# Patient Record
Sex: Female | Born: 1985 | Race: Black or African American | Hispanic: No | Marital: Single | Smoking: Never smoker
Health system: Southern US, Community
[De-identification: ages and names within clinical notes are randomized; demographics above are authoritative.]

## PROBLEM LIST (undated history)

## (undated) ENCOUNTER — Ambulatory Visit (HOSPITAL_COMMUNITY): Discharge status: Absent without leave | Payer: Self-pay

## (undated) DIAGNOSIS — B373 Candidiasis of vulva and vagina: Secondary | ICD-10-CM

## (undated) DIAGNOSIS — A749 Chlamydial infection, unspecified: Principal | ICD-10-CM

## (undated) DIAGNOSIS — R87619 Unspecified abnormal cytological findings in specimens from cervix uteri: Secondary | ICD-10-CM

## (undated) DIAGNOSIS — T7840XA Allergy, unspecified, initial encounter: Secondary | ICD-10-CM

## (undated) DIAGNOSIS — N870 Mild cervical dysplasia: Secondary | ICD-10-CM

## (undated) DIAGNOSIS — B009 Herpesviral infection, unspecified: Secondary | ICD-10-CM

## (undated) DIAGNOSIS — B3731 Acute candidiasis of vulva and vagina: Secondary | ICD-10-CM

## (undated) DIAGNOSIS — N970 Female infertility associated with anovulation: Secondary | ICD-10-CM

## (undated) DIAGNOSIS — D649 Anemia, unspecified: Secondary | ICD-10-CM

## (undated) DIAGNOSIS — IMO0002 Reserved for concepts with insufficient information to code with codable children: Secondary | ICD-10-CM

## (undated) DIAGNOSIS — N926 Irregular menstruation, unspecified: Secondary | ICD-10-CM

## (undated) HISTORY — DX: Unspecified abnormal cytological findings in specimens from cervix uteri: R87.619

## (undated) HISTORY — DX: Reserved for concepts with insufficient information to code with codable children: IMO0002

## (undated) HISTORY — DX: Candidiasis of vulva and vagina: B37.3

## (undated) HISTORY — DX: Allergy, unspecified, initial encounter: T78.40XA

## (undated) HISTORY — DX: Anemia, unspecified: D64.9

## (undated) HISTORY — DX: Irregular menstruation, unspecified: N92.6

## (undated) HISTORY — PX: WISDOM TOOTH EXTRACTION: SHX21

## (undated) HISTORY — DX: Mild cervical dysplasia: N87.0

## (undated) HISTORY — DX: Female infertility associated with anovulation: N97.0

## (undated) HISTORY — DX: Chlamydial infection, unspecified: A74.9

## (undated) HISTORY — DX: Herpesviral infection, unspecified: B00.9

## (undated) HISTORY — DX: Acute candidiasis of vulva and vagina: B37.31

---
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
## 2008-10-29 ENCOUNTER — Emergency Department (HOSPITAL_COMMUNITY): Admission: EM | Admit: 2008-10-29 | Discharge: 2008-10-29 | Payer: Self-pay | Admitting: Emergency Medicine

## 2010-04-16 LAB — URINE MICROSCOPIC-ADD ON

## 2010-04-16 LAB — COMPREHENSIVE METABOLIC PANEL
ALT: 26 U/L (ref 0–35)
AST: 50 U/L — ABNORMAL HIGH (ref 0–37)
Albumin: 4.4 g/dL (ref 3.5–5.2)
Alkaline Phosphatase: 68 U/L (ref 39–117)
BUN: 13 mg/dL (ref 6–23)
CO2: 23 mEq/L (ref 19–32)
Calcium: 9.7 mg/dL (ref 8.4–10.5)
Chloride: 105 mEq/L (ref 96–112)
Creatinine, Ser: 0.94 mg/dL (ref 0.4–1.2)
GFR calc Af Amer: >60 mL/min (ref >60)
GFR calc non Af Amer: >60 mL/min (ref >60)
Glucose, Bld: 78 mg/dL (ref 70–99)
Potassium: 4.1 mEq/L (ref 3.5–5.1)
Sodium: 138 mEq/L (ref 135–145)
Total Bilirubin: 0.2 mg/dL — ABNORMAL LOW (ref 0.3–1.2)
Total Protein: 7.5 g/dL (ref 6.0–8.3)

## 2010-04-16 LAB — CBC
HCT: 41.8 % (ref 36.0–46.0)
Hemoglobin: 14.3 g/dL (ref 12.0–15.0)
MCHC: 34.2 g/dL (ref 30.0–36.0)
MCV: 89.6 fL (ref 78.0–100.0)
Platelets: 208 10*3/uL (ref 150–400)
RBC: 4.67 MIL/uL (ref 3.87–5.11)
RDW: 13.3 % (ref 11.5–15.5)
WBC: 9.4 10*3/uL (ref 4.0–10.5)

## 2010-04-16 LAB — URINALYSIS, ROUTINE W REFLEX MICROSCOPIC
Hgb urine dipstick: NEGATIVE
Nitrite: NEGATIVE
pH: 6.5 (ref 5.0–8.0)

## 2010-04-16 LAB — DIFFERENTIAL
Basophils Absolute: 0 10*3/uL (ref 0.0–0.1)
Basophils Relative: 0 % (ref 0–1)
Eosinophils Absolute: 0 10*3/uL (ref 0.0–0.7)
Lymphocytes Relative: 18 % (ref 12–46)
Lymphs Abs: 1.7 10*3/uL (ref 0.7–4.0)
Monocytes Absolute: 0.5 10*3/uL (ref 0.1–1.0)
Monocytes Relative: 6 % (ref 3–12)
Neutro Abs: 7.2 10*3/uL (ref 1.7–7.7)

## 2010-04-16 LAB — POCT PREGNANCY, URINE: Preg Test, Ur: NEGATIVE

## 2011-08-18 ENCOUNTER — Ambulatory Visit: Payer: Self-pay | Admitting: Obstetrics and Gynecology

## 2011-08-18 DIAGNOSIS — A749 Chlamydial infection, unspecified: Secondary | ICD-10-CM | POA: Insufficient documentation

## 2011-08-18 DIAGNOSIS — Z87898 Personal history of other specified conditions: Secondary | ICD-10-CM | POA: Insufficient documentation

## 2011-08-18 DIAGNOSIS — B009 Herpesviral infection, unspecified: Secondary | ICD-10-CM

## 2011-08-18 DIAGNOSIS — D649 Anemia, unspecified: Secondary | ICD-10-CM | POA: Insufficient documentation

## 2011-08-18 DIAGNOSIS — N970 Female infertility associated with anovulation: Secondary | ICD-10-CM

## 2011-08-18 DIAGNOSIS — N926 Irregular menstruation, unspecified: Secondary | ICD-10-CM | POA: Insufficient documentation

## 2011-08-18 DIAGNOSIS — B373 Candidiasis of vulva and vagina: Secondary | ICD-10-CM | POA: Insufficient documentation

## 2011-08-18 DIAGNOSIS — IMO0002 Reserved for concepts with insufficient information to code with codable children: Secondary | ICD-10-CM | POA: Insufficient documentation

## 2011-08-18 DIAGNOSIS — N870 Mild cervical dysplasia: Secondary | ICD-10-CM | POA: Insufficient documentation

## 2011-08-24 ENCOUNTER — Ambulatory Visit (INDEPENDENT_AMBULATORY_CARE_PROVIDER_SITE_OTHER): Payer: BC Managed Care – PPO | Admitting: Obstetrics and Gynecology

## 2011-08-24 ENCOUNTER — Encounter: Payer: Self-pay | Admitting: Obstetrics and Gynecology

## 2011-08-24 VITALS — BP 120/64 | Ht 61.75 in | Wt 136.0 lb

## 2011-08-24 DIAGNOSIS — N898 Other specified noninflammatory disorders of vagina: Secondary | ICD-10-CM

## 2011-08-24 DIAGNOSIS — R109 Unspecified abdominal pain: Secondary | ICD-10-CM

## 2011-08-24 DIAGNOSIS — A749 Chlamydial infection, unspecified: Secondary | ICD-10-CM

## 2011-08-24 DIAGNOSIS — N870 Mild cervical dysplasia: Secondary | ICD-10-CM

## 2011-08-24 DIAGNOSIS — Z124 Encounter for screening for malignant neoplasm of cervix: Secondary | ICD-10-CM

## 2011-08-24 NOTE — Progress Notes (Signed)
AEX  Last Pap: 08/03/2010 WNL: Yes Regular Periods:yes Contraception: None  Monthly Breast exam:yes every 2 months Tetanus<8yrs:yes Nl.Bladder Function:yes Daily BMs:yes Healthy Diet:yes Calcium:no Mammogram:no Date of Mammogram: n/a Exercise:yes Have often Exercise:3-4 times weekly Seatbelt: yes Abuse at home: no Stressful work:yes Sigmoid-colonoscopy: none Bone Density: No PCP: None Change in [facility]: [facility] Change in [facility]: [facility] Subjective:    Kristina Joyce is a 26 y.o. female, G0P0000, who presents for an annual exam. Pt c/o pain in her right side. States that it varies from sharp pain to dull ache. States that it is more intense during cycle. This pain is similar to that experienced in 2007 when neg [place] was done.  It has continued since then, but is more frequent now, occuring almost monthly. Pt also c/o vaginal d/c on 07/11-07/13 that was clear with no odor.   History   Social History  . Marital Status: Single    Spouse Name: N/A    Number of Children: N/A  . Years of Education: N/A   Social History Main Topics  . Smoking status: Never Smoker   . Smokeless tobacco: Never Used  . Alcohol Use: Yes     occasional   . Drug Use: No  . Sexually Active: Yes    Birth Control/ Protection: None   Other Topics Concern  . None   Social History Narrative  . None    Menstrual cycle:   LMP: Patient's last menstrual period was 07/26/2011.           Cycle: Q28 DAYS for a total of 6 days with 2 days heavy.  No IM bleeding  The following portions of the patient's history were reviewed and updated as appropriate: allergies, current medications, past family history, past medical history, past social history, past surgical history and problem list.  Review of Systems Pertinent items are noted in HPI. Breast:Negative for breast lump,nipple discharge or nipple retraction Gastrointestinal: Negative for abdominal pain, change in bowel habits or rectal  bleeding Urinary:negative   Objective:    BP 120/64  Ht 5' 1.75" (1.568 m)  Wt 136 lb (61.689 kg)  BMI 25.08 kg/m2  LMP 07/26/2011    Weight:  Wt Readings from Last 1 Encounters:  08/24/11 136 lb (61.689 kg)          BMI: Body mass index is 25.08 kg/(m^2).  General Appearance: Alert, appropriate appearance for age. No acute distress HEENT: Grossly normal Neck / Thyroid: Supple, no masses, nodes or enlargement Lungs: clear to auscultation bilaterally Back: No CVA tenderness Breast Exam: No masses or nodes.No dimpling, nipple retraction or discharge. Cardiovascular: Regular rate and rhythm. S1, S2, no murmur Gastrointestinal: Soft, non-tender, no masses or organomegaly Pelvic Exam: External genitalia: normal general appearance Vaginal: normal mucosa without prolapse or lesions Cervix: normal appearance Adnexa: no masses noted Uterus: retroverted, tender and irregular enlargement Rectovaginal: normal rectal, no masses Lymphatic Exam: Non-palpable nodes in neck, clavicular, axillary, or inguinal regions Skin: no rash or abnormalities Neurologic: Normal gait and speech, no tremor  Psychiatric: Alert and oriented, appropriate affect.   Wet Prep:neg whiff. Urinalysis:not applicable UPT: Not done   Assessment:    Persistent right side and flank pain  Possible fibroids with family hx of same   Plan:    pap smear STD screening: done GC/Chl Contraception:no method, declined U/S at [facility]      [facility] PMD

## 2011-08-26 LAB — PAP IG, CT-NG, RFX HPV ASCU
Chlamydia Probe Amp: NEGATIVE
GC Probe Amp: NEGATIVE

## 2011-09-29 ENCOUNTER — Ambulatory Visit (INDEPENDENT_AMBULATORY_CARE_PROVIDER_SITE_OTHER): Payer: BC Managed Care – PPO | Admitting: Obstetrics and Gynecology

## 2011-09-29 ENCOUNTER — Encounter: Payer: Self-pay | Admitting: Obstetrics and Gynecology

## 2011-09-29 ENCOUNTER — Other Ambulatory Visit: Payer: Self-pay | Admitting: Obstetrics and Gynecology

## 2011-09-29 ENCOUNTER — Ambulatory Visit (INDEPENDENT_AMBULATORY_CARE_PROVIDER_SITE_OTHER): Payer: BC Managed Care – PPO

## 2011-09-29 VITALS — BP 100/68 | Ht 61.0 in | Wt 137.0 lb

## 2011-09-29 DIAGNOSIS — R109 Unspecified abdominal pain: Secondary | ICD-10-CM

## 2011-09-29 DIAGNOSIS — N949 Unspecified condition associated with female genital organs and menstrual cycle: Secondary | ICD-10-CM

## 2011-09-29 DIAGNOSIS — D259 Leiomyoma of uterus, unspecified: Secondary | ICD-10-CM

## 2011-09-29 DIAGNOSIS — D219 Benign neoplasm of connective and other soft tissue, unspecified: Secondary | ICD-10-CM

## 2011-09-29 DIAGNOSIS — R102 Pelvic and perineal pain: Secondary | ICD-10-CM

## 2011-09-29 NOTE — Progress Notes (Signed)
F/u visit  SUBJECTIVE: Hasn't had any further episodes of pain since last visit  OBJECTIVE: BP 100/68  Ht 5\' 1"  (1.549 m)  Wt 137 lb (62.143 kg)  BMI 25.89 kg/m2  LMP 09/24/2011  ULTRASOUND: Uterus: Length: 6.79 cm   Width:  4.54 cm   Height:  3.66 cm    Endometrium:   0.355 cm    ROV:   Length: 3.38 cm   Width:   2.56 cm   Height:   1.98 cm  LOV:   Length:  3.38 cm   Width:   2.52 cm   Height:   1.74 cm  Endometrium: 0.355 cm  Left ovary:Normal Right ovary:Normal Fibroids:yes one less than 1 cm.    CDS fluid:yes small  Comment: Transabdominal and transvaginal images of pelvis. Urinary bladder - unremarkable. Retroverted uterus. One LUS fibroid - less than 1 cm is noted.  Endometrium is WNLs Ovaries/Adnexa - WNL Small amount of CDS fluid. WNL  ASSESSMENT: Small asymptomatic fibroid Resolved pelvic pain  RECOMMENDATION: F/u at aex

## 2011-12-08 ENCOUNTER — Ambulatory Visit (INDEPENDENT_AMBULATORY_CARE_PROVIDER_SITE_OTHER): Payer: BC Managed Care – PPO | Admitting: Physician Assistant

## 2011-12-08 VITALS — BP 116/60 | HR 90 | Temp 97.7°F | Resp 16 | Ht 61.0 in | Wt 137.0 lb

## 2011-12-08 DIAGNOSIS — Z111 Encounter for screening for respiratory tuberculosis: Secondary | ICD-10-CM

## 2011-12-08 DIAGNOSIS — Z0000 Encounter for general adult medical examination without abnormal findings: Secondary | ICD-10-CM

## 2011-12-08 DIAGNOSIS — Z23 Encounter for immunization: Secondary | ICD-10-CM

## 2011-12-08 NOTE — Patient Instructions (Signed)
Once the shortage of the TB test is over, I encourage you to get a TB skin test.  Keeping You Healthy  Get These Tests 1. Blood Pressure- Have your blood pressure checked once a year by your health care provider.  Normal blood pressure is 120/80. 2. Weight- Have your body mass index (BMI) calculated to screen for obesity.  BMI is measure of body fat based on height and weight.  You can also calculate your own BMI at https://www.west-esparza.com/. 3. Cholesterol- Have your cholesterol checked every 5 years starting at age 71 then yearly starting at age 68. 4. Chlamydia, HIV, and other sexually transmitted diseases- Get screened every year until age 58, then within three months of each new sexual provider. 5. Pap Smear- Every 1-3 years; discuss with your health care provider. 6. Mammogram- Every year starting at age 4  Take these medicines  Calcium with Vitamin D-Your body needs 1200 mg of Calcium each day and [phone] IU of Vitamin D daily.  Your body can only absorb 500 mg of Calcium at a time so Calcium must be taken in 2 or 3 divided doses throughout the day.  Multivitamin with folic acid- Once daily if it is possible for you to become pregnant.  Get these Immunizations  Gardasil-Series of three doses; prevents HPV related illness such as genital warts and cervical cancer.  Menactra-Single dose; prevents meningitis.  Tetanus shot- Every 10 years.  Flu shot-Every year.  Take these steps 1. Do not smoke-Your healthcare provider can help you quit.  For tips on how to quit go to www.smokefree.gov or call 1-800 QUITNOW. 2. Be physically active- Exercise 5 days a week for at least 30 minutes.  If you are not already physically active, start slow and gradually work up to 30 minutes of moderate physical activity.  Examples of moderate activity include walking briskly, dancing, swimming, bicycling, etc. 3. Breast Cancer- A self breast exam every month is important for early detection of breast  cancer.  For more information and instruction on self breast exams, ask your healthcare provider or [place].es. 4. Eat a healthy diet- Eat a variety of healthy foods such as fruits, vegetables, whole grains, low fat milk, low fat cheeses, yogurt, lean meats, poultry and fish, beans, nuts, tofu, etc.  For more information go to www. Thenutritionsource.org 5. Drink alcohol in moderation- Limit alcohol intake to one drink or less per day. Never drink and drive. 6. Depression- Your emotional health is as important as your physical health.  If you're feeling down or losing interest in things you normally enjoy please talk to your healthcare provider about being screened for depression. 7. Dental visit- Brush and floss your teeth twice daily; visit your dentist twice a year. 8. Eye doctor- Get an eye exam at least every 2 years. 9. Helmet use- Always wear a helmet when riding a bicycle, motorcycle, rollerblading or skateboarding. 10. Safe sex- If you may be exposed to sexually transmitted infections, use a condom. 11. Seat belts- Seat belts can save your live; always wear one. 12. Smoke/Carbon Monoxide detectors- These detectors need to be installed on the appropriate level of your home. Replace batteries at least once a year. 13. Skin cancer- When out in the sun please cover up and use sunscreen 15 SPF or higher. 14. Violence- If anyone is threatening or hurting you, please tell your healthcare provider.

## 2011-12-08 NOTE — Progress Notes (Signed)
  Tuberculosis Risk Questionnaire  1. Were you born outside the [place] in one of the following parts of the world:    [place], [place], [place], [place] or [place]?  No  2. Have you traveled outside the [place] and lived for more than one month in one of the following parts of the world:  [place], [place], [place], [place] or [place]?  No  3. Do you have a compromised immune system such as from any of the following conditions:  HIV/AIDS, organ or bone marrow transplantation, diabetes, immunosuppressive   medicines (e.g. Prednisone, Remicaide), leukemia, lymphoma, cancer of the   head or neck, gastrectomy or jejunal bypass, end-stage renal disease (on   dialysis), or silicosis?  No    4. 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 Have you ever been exposed to anyone with infectious tuberculosis?  No   Tuberculosis Symptom Questionnaire  Do you currently have any of the following symptoms?  1. Unexplained cough lasting more than 3 weeks? No  Unexplained fever lasting more than 3 weeks. No   3. Night Sweats (sweating that leaves the bedclothes and sheets wet)   No  4. Shortness of Breath No  5. Chest Pain No  6. Unintentional weight loss  No  7. Unexplained fatigue (very tired for no reason) No

## 2011-12-08 NOTE — Progress Notes (Signed)
Subjective:    Patient ID: Kristina Joyce, female    DOB: 03/11/1985, 26 y.o.   MRN: [id number]  HPI This 26 y.o. female presents for CPE (pap and breast with GYN 10/2011), TB screening required for work, and influenza vaccine.  She feels well, without problems or concerns.  Past Medical History  Diagnosis Date  . Chlamydia 2007  . Irregular menstrual cycle 2006, Resolved, per patient   . Anemia 2006, Resolved, per patient   . Dysplasia of cervix, low grade (CIN 1)   . HGSIL (high grade squamous intraepithelial dysplasia) Patient unaware of this  . Oligo-ovulation 2006, Resolved, per patient   . Candida vaginitis   . Abnormal Pap smear Reports having follow-up paps Q 6 mos x 3.  All normal since then.  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  Denies ever having had a fever blister or painful blister in the "boxer shorts" area. Also unaware of any HSV testing previously.  . Allergy     Past Surgical History  Procedure Date  . Wisdom tooth extraction     Prior to Admission medications   Medication Sig Start Date End Date Taking? Authorizing Provider  fexofenadine (Aletha) 180 MG tablet Take 180 mg by mouth daily.   Yes Historical Provider, MD  Multiple Vitamin (MULTIVITAMIN) tablet Take 1 tablet by mouth daily.   Yes Historical Provider, MD  Specialty Vitamins Products (VITAMINS FOR HAIR PO) Take 1 tablet by mouth daily.   Yes Historical Provider, MD    No Known Allergies  History   Social History  . Marital Status: Single    Spouse Name: n/a    Number of Children: 0  . Years of Education: 16   Occupational History  . 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 Phlebotomist     part-time   Social History Main Topics  . Smoking status: Never Smoker   . Smokeless tobacco: Never Used  . Alcohol Use: 0.0 - 1.2 oz/week    0-2 Glasses of wine per week     Comment: once a month  . Drug Use: No  . Sexually Active:  Not Currently -- Female partner(s)    Birth Control/ Protection: Condom   Other Topics Concern  . Not on file   Social History Narrative   Lives with her parents.    Family History  Problem Relation Age of Onset  . Cancer Maternal Aunt 1    Breast cancer  . Hypertension Maternal Grandmother   . Diabetes Maternal Grandmother   . Cancer Cousin 29 (patient unaware of any genetic testing in this relative)    Breast Cancer    Review of Systems No chest pain, SOB, HA, dizziness, vision change, N/V, diarrhea, constipation, dysuria, urinary urgency or frequency, myalgias, arthralgias or rash.     Objective:   Physical Exam  Vitals reviewed. Constitutional: She is oriented to person, place, and time. Vital signs are normal. She appears well-developed and well-nourished. No distress.  HENT:  Head: Normocephalic and atraumatic.  Right Ear: Hearing, tympanic membrane, external ear and ear canal normal. No foreign bodies.  Left Ear: Hearing, tympanic membrane, external ear and ear canal normal. No foreign bodies.  Nose: Nose normal.  Mouth/Throat: Uvula is midline, oropharynx is clear and moist and mucous membranes are normal. No oral lesions. Normal dentition. No dental abscesses or uvula swelling. No oropharyngeal exudate.  Eyes: Conjunctivae normal and EOM are normal. Pupils are equal, round,  and reactive to light. Right eye exhibits no discharge. Left eye exhibits no discharge. No scleral icterus.  Fundoscopic exam:      The right eye shows no arteriolar narrowing, no AV nicking, no exudate, no hemorrhage and no papilledema. The right eye shows red reflex.The right eye shows no venous pulsations.      The left eye shows no arteriolar narrowing, no AV nicking, no exudate, no hemorrhage and no papilledema. The left eye shows red reflex.The left eye shows no venous pulsations. Neck: Trachea normal, normal range of motion and full passive range of motion without pain. Neck supple. No spinous  process tenderness and no muscular tenderness present. No mass and no thyromegaly present.  Cardiovascular: Normal rate, regular rhythm, normal heart sounds, intact distal pulses and normal pulses.   Pulmonary/Chest: Effort normal and breath sounds normal.  Genitourinary: Rectum normal and vagina normal.  Musculoskeletal: She exhibits no edema and no tenderness.       Cervical back: Normal.       Thoracic back: Normal.       Lumbar back: Normal.  Lymphadenopathy:       Head (right side): No tonsillar, no preauricular, no posterior auricular and no occipital adenopathy present.       Head (left side): No tonsillar, no preauricular, no posterior auricular and no occipital adenopathy present.    She has no cervical adenopathy.       Right: No supraclavicular adenopathy present.       Left: No supraclavicular adenopathy present.  Neurological: She is alert and oriented to person, place, and time. She has normal strength and normal reflexes. No cranial nerve deficit. She exhibits normal muscle tone. Coordination and gait normal.  Skin: Skin is warm, dry and intact. No rash noted. She is not diaphoretic. No cyanosis or erythema. Nails show no clubbing.  Psychiatric: She has a normal mood and affect. Her speech is normal and behavior is normal. Judgment and thought content normal.      Assessment & Plan:   1. Routine general medical examination at a health care facility  Age appropriate anticipatory guidance provided.   2. Screening-pulmonary TB  Letter provided.  No infectious TB.  Defer screening until skin test shortage resolved.  3. Need for influenza vaccination  Flu vaccine greater than or equal to 3yo preservative free IM   I will forward this note to Dr. Pennie Rushing to clarify the patient's history of HGSIL pap and HSV type 1 infection.  Resolved the items from her problem list that are no longer active, per the patient.  Advised the patient to contact her cousin to find out if she had any  genetic testing done that could change the breast cancer screening recommendations for the patient.

## 2016-03-19 ENCOUNTER — Ambulatory Visit (INDEPENDENT_AMBULATORY_CARE_PROVIDER_SITE_OTHER): Payer: Worker's Compensation

## 2016-03-19 ENCOUNTER — Ambulatory Visit (HOSPITAL_COMMUNITY)
Admission: EM | Admit: 2016-03-19 | Discharge: 2016-03-19 | Disposition: A | Discharge status: Home / Self Care | Payer: Worker's Compensation | Attending: Emergency Medicine | Admitting: Emergency Medicine

## 2016-03-19 ENCOUNTER — Encounter (HOSPITAL_COMMUNITY): Payer: Self-pay | Admitting: Emergency Medicine

## 2016-03-19 DIAGNOSIS — M25562 Pain in left knee: Secondary | ICD-10-CM

## 2016-03-19 MED ORDER — TRAMADOL HCL 50 MG PO TABS: 50.0000 mg | ORAL_TABLET | Freq: Four times a day (QID) | ORAL | 0 refills | Status: DC | PRN

## 2016-03-19 MED ORDER — DICLOFENAC SODIUM 75 MG PO TBEC: 75.0000 mg | DELAYED_RELEASE_TABLET | Freq: Two times a day (BID) | ORAL | 0 refills | Status: DC

## 2016-03-19 NOTE — ED Provider Notes (Signed)
HPI  SUBJECTIVE:  Kristina Joyce is a 31 y.o. female who presents with constant, throbbing left knee pain. Patient states she was playing a basketball game at work, came down and felt a "pop" of her knee. She is unsure if she bent her knee outward inward. She states that she can't fully extend her knee. She tried ice with some improvement in her symptoms. Symptoms are worse with any range of motion. She was unable to bear weight on it immediately and is unable to bear weight on it here in the department. No erythema, distal numbness or tingling. Reports weight secondary to pain. Past medical history negative for left knee injury, diabetes, hypertension. LMP: 2/19. PMD: Dr. Ebony Hail  Discussed worker's comp case. She is a Careers information officer for [employer]    Past Medical History:  Diagnosis Date  . Abnormal Pap smear   . Allergy   . Anemia   . Candida vaginitis   . Chlamydia 2007  . Dysplasia of cervix, low grade (CIN 1)   . HGSIL (high grade squamous intraepithelial dysplasia)   . HSV-1 infection   . Irregular menstrual cycle   . Oligo-ovulation     Past Surgical History:  Procedure Laterality Date  . WISDOM TOOTH EXTRACTION      Family History  Problem Relation Age of Onset  . Cancer Maternal Aunt 57    Breast cancer  . Hypertension Maternal Grandmother   . Diabetes Maternal Grandmother   . Cancer Cousin 29    Breast Cancer    Social History  Substance Use Topics  . Smoking status: Never Smoker  . Smokeless tobacco: Never Used  . Alcohol use 0.0 - 1.2 oz/week     Comment: once a month    No current facility-administered medications for this encounter.   Current Outpatient Prescriptions:  .  diclofenac (VOLTAREN) 75 MG EC tablet, Take 1 tablet (75 mg total) by mouth 2 (two) times daily. Take with food, Disp: 30 tablet, Rfl: 0 .  fexofenadine (Dorthula) 180 MG tablet, Take 180 mg by mouth daily., Disp: , Rfl:  .  Multiple Vitamin (MULTIVITAMIN) tablet, Take 1  tablet by mouth daily., Disp: , Rfl:  .  Specialty Vitamins Products (VITAMINS FOR HAIR PO), Take 1 tablet by mouth daily., Disp: , Rfl:  .  traMADol (ULTRAM) 50 MG tablet, Take 1 tablet (50 mg total) by mouth every 6 (six) hours as needed., Disp: 20 tablet, Rfl: 0  No Known Allergies   ROS  As noted in HPI.   Physical Exam  BP 117/80 (BP Location: Left Arm)   Pulse 92   Temp 99.4 F (37.4 C) (Oral)   Resp 14   LMP 03/01/2016   SpO2 100%   Constitutional: Well developed, well nourished, mild Painful distress Eyes:  EOMI, conjunctiva normal bilaterally HENT: Normocephalic, atraumatic,mucus membranes moist Respiratory: Normal inspiratory effort Cardiovascular: Normal rate GI: nondistended skin: No rash, skin intact Musculoskeletal: Positive mild swelling of the knee, L Knee ROM decreased due to pain,  unable to flex to 90 degrees and fully extend due to pain, prefers to hold knee slightly flexed, Patella NT, Patellar apprehension test negative, Patellar tendon NT, Medial joint  tender, Lateral joint NT, Popliteal region NT, positive laxity with anterior drawer test but she has an endpoint, Varus LCL stress testing stable but painful, Valgus MCL stress testing stable but painful, McMurray's testing normal, distal NVI with intact baseline sensation / motor / pulse distal to knee. No effusion. No  erythema. No increased temperature.  Neurologic: Alert & oriented x 3, no focal neuro deficits Psychiatric: Speech and behavior appropriate   ED Course   Medications - No data to display  Orders Placed This Encounter  Procedures  . DG Knee Complete 4 Views Left    Standing Status:   Standing    Number of Occurrences:   1    Order Specific Question:   Reason for Exam (SYMPTOM  OR DIAGNOSIS REQUIRED)    Answer:   trauma  . Apply knee immobilizer    Standing Status:   Standing    Number of Occurrences:   1    Order Specific Question:   Laterality    Answer:   Left    Order Specific  Question:   Knee Immobilizer Instruction    Answer:   When walking  . Crutches    Standing Status:   Standing    Number of Occurrences:   1    No results found for this or any previous visit (from the past 24 hour(s)). Dg Knee Complete 4 Views Left  Result Date: 03/19/2016 CLINICAL DATA:  [place] at basketball game a few hours ago, injured LEFT knee. EXAM: LEFT KNEE - COMPLETE 4+ VIEW COMPARISON:  None. FINDINGS: No evidence of fracture, dislocation, or joint effusion. No evidence of arthropathy or other focal bone abnormality. Soft tissues are unremarkable. IMPRESSION: Negative. Electronically Signed   By: Elon Alas M.D.   On: 03/19/2016 20:22    ED Clinical Impression  Acute pain of left knee   ED Assessment/Plan  Reviewed imaging independently. No fracture, dislocation, effusion. See radiology report for details.  Suspect partial anterior cruciate ligament tear and also medial lateral collateral limits this injury based on physical exam.  We will write her out for Monday and Tuesday so that she has time to follow-up with occupational health. She is not working over the weekend, so this is not more than 3 days. Gave patient knee immobilizer and instructed her to take her knee out frequently and move it around. Instructed her to not wear it for more than for 5 days. also home with.  diclofenac, tramadol, ice, elevation.Follow-up with occupational health Monday or Tuesday for possible referral to orthopedics and further work restrictions. To the ER if she gets worse   Discussed imaging, MDM, plan and followup with patient. Discussed sn/sx that should prompt return to the ED. Patient  agrees with plan.   Meds ordered this encounter  Medications  . diclofenac (VOLTAREN) 75 MG EC tablet    Sig: Take 1 tablet (75 mg total) by mouth 2 (two) times daily. Take with food    Dispense:  30 tablet    Refill:  0  . traMADol (ULTRAM) 50 MG tablet    Sig: Take 1 tablet (50 mg total) by mouth  every 6 (six) hours as needed.    Dispense:  20 tablet    Refill:  0    *This clinic note was created using Lobbyist. Therefore, there may be occasional mistakes despite careful proofreading.  ?   Melynda Ripple, MD 03/19/16 2131

## 2016-03-19 NOTE — Discharge Instructions (Signed)
°  Follow-up with occupational health on Monday or Tuesday. You will most likely need a referral to orthopedics.  [address], [place], [place] [address] Phone: [phone]  Take your knee out and move it around as much as possible. Do not wear the knee immobilizer for more than 4 or 5 days. If crutches are too cumbersome, then try a cane.  Diclofenac twice a day. 1 g of Tylenol 4 times a day. Tramadol for severe pain only. It is okay take all of these medicines together.

## 2016-03-19 NOTE — ED Triage Notes (Signed)
Pt reports she was playing basketball today around 1830 and went up for a lay up and when she came down, her knee "buckled" and pt heard a pop  Pain increases w/activity and unable to extend leg  Brought back on wheelchair... A&O x4... NAD

## 2016-03-22 ENCOUNTER — Ambulatory Visit (INDEPENDENT_AMBULATORY_CARE_PROVIDER_SITE_OTHER): Payer: Self-pay | Admitting: Orthopedic Surgery

## 2016-03-23 NOTE — ED Notes (Signed)
Pt called needing excuse note to not fly on an airplane  Per Dr. Valere Dross, ok to supply pt w/note  Note left at front office.

## 2018-07-18 ENCOUNTER — Other Ambulatory Visit: Payer: Self-pay | Admitting: *Deleted

## 2018-07-18 DIAGNOSIS — Z20822 Contact with and (suspected) exposure to covid-19: Secondary | ICD-10-CM

## 2018-07-24 LAB — NOVEL CORONAVIRUS, NAA: SARS-CoV-2, NAA: NOT DETECTED

## 2018-07-25 ENCOUNTER — Telehealth: Payer: Self-pay

## 2018-07-25 NOTE — Telephone Encounter (Signed)
Patient called and received her test results

## 2019-10-31 ENCOUNTER — Emergency Department (HOSPITAL_COMMUNITY): Payer: BC Managed Care – PPO

## 2019-10-31 ENCOUNTER — Other Ambulatory Visit: Payer: Self-pay

## 2019-10-31 ENCOUNTER — Emergency Department (HOSPITAL_COMMUNITY)
Admission: EM | Admit: 2019-10-31 | Discharge: 2019-10-31 | Disposition: A | Discharge status: Home / Self Care | Payer: BC Managed Care – PPO | Attending: Emergency Medicine | Admitting: Emergency Medicine

## 2019-10-31 ENCOUNTER — Encounter (HOSPITAL_COMMUNITY): Payer: Self-pay | Admitting: Emergency Medicine

## 2019-10-31 DIAGNOSIS — D259 Leiomyoma of uterus, unspecified: Secondary | ICD-10-CM | POA: Diagnosis not present

## 2019-10-31 DIAGNOSIS — R110 Nausea: Secondary | ICD-10-CM | POA: Insufficient documentation

## 2019-10-31 DIAGNOSIS — R102 Pelvic and perineal pain: Secondary | ICD-10-CM

## 2019-10-31 DIAGNOSIS — R1031 Right lower quadrant pain: Secondary | ICD-10-CM | POA: Diagnosis present

## 2019-10-31 LAB — COMPREHENSIVE METABOLIC PANEL
ALT: 16 U/L (ref 0–44)
AST: 22 U/L (ref 15–41)
Albumin: 4.2 g/dL (ref 3.5–5.0)
Alkaline Phosphatase: 42 U/L (ref 38–126)
Anion gap: 8 (ref 5–15)
BUN: 12 mg/dL (ref 6–20)
CO2: 23 mmol/L (ref 22–32)
Calcium: 9.4 mg/dL (ref 8.9–10.3)
Chloride: 104 mmol/L (ref 98–111)
Creatinine, Ser: 0.86 mg/dL (ref 0.44–1.00)
GFR, Estimated: >60 mL/min (ref >60)
Glucose, Bld: 112 mg/dL — ABNORMAL HIGH (ref 70–99)
Potassium: 3.8 mmol/L (ref 3.5–5.1)
Sodium: 135 mmol/L (ref 135–145)
Total Bilirubin: 0.5 mg/dL (ref 0.3–1.2)
Total Protein: 7.3 g/dL (ref 6.5–8.1)

## 2019-10-31 LAB — URINALYSIS, ROUTINE W REFLEX MICROSCOPIC
Bilirubin Urine: NEGATIVE
Glucose, UA: NEGATIVE mg/dL
Hgb urine dipstick: NEGATIVE
Ketones, ur: 5 mg/dL — AB
Leukocytes,Ua: NEGATIVE
Nitrite: NEGATIVE
Protein, ur: 30 mg/dL — AB
Specific Gravity, Urine: 1.024 (ref 1.005–1.030)
pH: 7 (ref 5.0–8.0)

## 2019-10-31 LAB — CBC
HCT: 39.4 % (ref 36.0–46.0)
Hemoglobin: 13 g/dL (ref 12.0–15.0)
MCH: 29 pg (ref 26.0–34.0)
MCHC: 33 g/dL (ref 30.0–36.0)
MCV: 87.9 fL (ref 80.0–100.0)
Platelets: 202 10*3/uL (ref 150–400)
RBC: 4.48 MIL/uL (ref 3.87–5.11)
RDW: 13.1 % (ref 11.5–15.5)
WBC: 9.3 10*3/uL (ref 4.0–10.5)
nRBC: 0 % (ref 0.0–0.2)

## 2019-10-31 LAB — I-STAT BETA HCG BLOOD, ED (MC, WL, AP ONLY): I-stat hCG, quantitative: <5 m[IU]/mL (ref <5)

## 2019-10-31 LAB — LIPASE, BLOOD: Lipase: 29 U/L (ref 11–51)

## 2019-10-31 MED ORDER — IOHEXOL 300 MG/ML  SOLN
100.0000 mL | Freq: Once | INTRAMUSCULAR | Status: AC | PRN
  Administered 2019-10-31: 100 mL via INTRAVENOUS

## 2019-10-31 MED ORDER — ACETAMINOPHEN 325 MG PO TABS
650.0000 mg | ORAL_TABLET | Freq: Once | ORAL | Status: AC
  Administered 2019-10-31: 650 mg via ORAL
  Filled 2019-10-31: qty 2

## 2019-10-31 MED ORDER — SODIUM CHLORIDE 0.9 % IV BOLUS
500.0000 mL | Freq: Once | INTRAVENOUS | Status: AC
  Administered 2019-10-31: 500 mL via INTRAVENOUS

## 2019-10-31 MED ORDER — MORPHINE SULFATE (PF) 4 MG/ML IV SOLN
4.0000 mg | Freq: Once | INTRAVENOUS | Status: AC
  Administered 2019-10-31: 4 mg via INTRAVENOUS
  Filled 2019-10-31 (×2): qty 1

## 2019-10-31 MED ORDER — ONDANSETRON HCL 4 MG/2ML IJ SOLN
4.0000 mg | Freq: Once | INTRAMUSCULAR | Status: AC
  Administered 2019-10-31: 4 mg via INTRAVENOUS
  Filled 2019-10-31: qty 2

## 2019-10-31 MED ORDER — HYDROMORPHONE HCL 1 MG/ML IJ SOLN
0.5000 mg | Freq: Once | INTRAMUSCULAR | Status: AC
  Administered 2019-10-31: 0.5 mg via INTRAVENOUS
  Filled 2019-10-31: qty 1

## 2019-10-31 NOTE — ED Provider Notes (Signed)
[facility] EMERGENCY DEPARTMENT Provider Note   CSN: [id number] Arrival date & time: 10/31/19  1123     History Chief Complaint  Patient presents with  . Abdominal Pain    Kristina Joyce is a 34 y.o. female presents today for right lower quadrant pain onset 2 AM this morning.  Patient describes intermittent sharp pain nonradiating moderate intensity worsened with laying improved with standing denies similar pain in the past.  Associated with nausea without vomiting.  Denies fever/chills, fall/injury, chest pain/shortness of breath, cough, upper abdominal pain, dysuria/hematuria, vaginal bleeding/discharge, concern for STI or any additional concerns.  HPI     Past Medical History:  Diagnosis Date  . Abnormal Pap smear   . Allergy   . Anemia   . Candida vaginitis   . Chlamydia 2007  . Dysplasia of cervix, low grade (CIN 1)   . HGSIL (high grade squamous intraepithelial dysplasia)   . HSV-1 infection   . Irregular menstrual cycle   . Oligo-ovulation     Patient Active Problem List   Diagnosis Date Noted  . Fibroid 09/29/2011  . Anemia   . Candida vaginitis   . History of abnormal Pap smear   . HSV-1 infection     Past Surgical History:  Procedure Laterality Date  . WISDOM TOOTH EXTRACTION       OB History    Gravida  0   Para  0   Term  0   Preterm  0   AB  0   Living  0     SAB  0   TAB  0   Ectopic  0   Multiple  0   Live Births              Family History  Problem Relation Age of Onset  . Cancer Maternal Aunt 57       Breast cancer  . Hypertension Maternal Grandmother   . Diabetes Maternal Grandmother   . Cancer Cousin 29       Breast Cancer    Social History   Tobacco Use  . Smoking status: Never Smoker  . Smokeless tobacco: Never Used  Substance Use Topics  . Alcohol use: Yes    Alcohol/week: 0.0 - 2.0 standard drinks    Comment: once a month  . Drug use: No    Home Medications Prior to  Admission medications   Medication Sig Start Date End Date Taking? Authorizing Provider  diclofenac (VOLTAREN) 75 MG EC tablet Take 1 tablet (75 mg total) by mouth 2 (two) times daily. Take with food 03/19/16   Melynda Ripple, MD  fexofenadine (Raguel) 180 MG tablet Take 180 mg by mouth daily.    [provider]  Multiple Vitamin (MULTIVITAMIN) tablet Take 1 tablet by mouth daily.    [provider]  Specialty Vitamins Products (VITAMINS FOR HAIR PO) Take 1 tablet by mouth daily.    [provider]  traMADol (ULTRAM) 50 MG tablet Take 1 tablet (50 mg total) by mouth every 6 (six) hours as needed. 03/19/16   Melynda Ripple, MD    Allergies    Patient has no known allergies.  Review of Systems   Review of Systems Ten systems are reviewed and are negative for acute change except as noted in the HPI  Physical Exam Updated Vital Signs BP 120/81   Pulse 80   Temp 98 F (36.7 C) (Oral)   Resp 14   Ht 5\' 1"  (1.549  m)   Wt 65.3 kg   LMP 10/05/2019   SpO2 100%   BMI 27.21 kg/m   Physical Exam Constitutional:      General: She is not in acute distress.    Appearance: Normal appearance. She is well-developed. She is not ill-appearing or diaphoretic.  HENT:     Head: Normocephalic and atraumatic.  Eyes:     General: Vision grossly intact. Gaze aligned appropriately.     Pupils: Pupils are equal, round, and reactive to light.  Neck:     Trachea: Trachea and phonation normal.  Pulmonary:     Effort: Pulmonary effort is normal. No respiratory distress.  Abdominal:     General: There is no distension.     Palpations: Abdomen is soft.     Tenderness: There is abdominal tenderness in the right lower quadrant. There is no guarding or rebound.  Genitourinary:    Comments: Refused by patient Musculoskeletal:        General: Normal range of motion.     Cervical back: Normal range of motion.  Skin:    General: Skin is warm and dry.  Neurological:     Mental  Status: She is alert.     GCS: GCS eye subscore is 4. GCS verbal subscore is 5. GCS motor subscore is 6.     Comments: Speech is clear and goal oriented, follows commands Major Cranial nerves without deficit, no facial droop Moves extremities without ataxia, coordination intact  Psychiatric:        Behavior: Behavior normal.     ED Results / Procedures / Treatments   Labs (all labs ordered are listed, but only abnormal results are displayed) Labs Reviewed  COMPREHENSIVE METABOLIC PANEL - Abnormal; Notable for the following components:      Result Value   Glucose, Bld 112 (*)    All other components within normal limits  URINALYSIS, ROUTINE W REFLEX MICROSCOPIC - Abnormal; Notable for the following components:   APPearance HAZY (*)    Ketones, ur 5 (*)    Protein, ur 30 (*)    Bacteria, UA RARE (*)    All other components within normal limits  LIPASE, BLOOD  CBC  I-STAT BETA HCG BLOOD, ED ([facility], [facility], [facility] ONLY)    EKG None  Radiology CT ABDOMEN PELVIS W CONTRAST  Result Date: 10/31/2019 CLINICAL DATA:  Right lower quadrant pain EXAM: CT ABDOMEN AND PELVIS WITH CONTRAST TECHNIQUE: Multidetector CT imaging of the abdomen and pelvis was performed using the standard protocol following bolus administration of intravenous contrast. CONTRAST:  119mL OMNIPAQUE IOHEXOL 300 MG/ML  SOLN COMPARISON:  None. FINDINGS: Lower chest: No acute abnormality. Hepatobiliary: No focal liver abnormality is seen. No gallstones, gallbladder wall thickening, or biliary dilatation. Pancreas: Unremarkable. Spleen: Unremarkable. Adrenals/Urinary Tract: Adrenals are unremarkable. There is mild right hydroureteronephrosis likely secondary to fibroid uterus. Bladder is unremarkable. Stomach/Bowel: Stomach is within normal limits. Bowel is normal in caliber. Appendix is normal in caliber. Vascular/Lymphatic: No significant vascular findings are present. There are no enlarged lymph nodes identified. Reproductive: Bulky  fibroid uterus.  No adnexal mass identified. Other: Trace pelvic free fluid is probably physiologic. No abdominal wall hernia. Musculoskeletal: No acute or significant osseous abnormality. IMPRESSION: Normal appendix. Bulky, fibroid uterus. Likely associated mild hydroureteronephrosis. Electronically Signed   By: Macy Mis M.D.   On: 10/31/2019 15:29   US PELVIC COMPLETE W TRANSVAGINAL AND TORSION R/O  Result Date: 10/31/2019 CLINICAL DATA:  RIGHT lower quadrant pain since early this  morning with nausea and vomiting, LMP 10/04/2019 EXAM: TRANSABDOMINAL AND TRANSVAGINAL ULTRASOUND OF PELVIS DOPPLER ULTRASOUND OF OVARIES TECHNIQUE: Both transabdominal and transvaginal ultrasound examinations of the pelvis were performed. Transabdominal technique was performed for global imaging of the pelvis including uterus, ovaries, adnexal regions, and pelvic cul-de-sac. It was necessary to proceed with endovaginal exam following the transabdominal exam to visualize the endometrium. Color and duplex Doppler ultrasound was utilized to evaluate blood flow to the ovaries. COMPARISON:  None FINDINGS: Uterus Measurements: 13.5 x 7.3 x 11.0 cm = volume: 566 mL. Heterogeneous myometrium. Anteverted. Multiple masses consistent with leiomyomata. Largest mass is at the posterior uterus 11.9 x 7.5 x 6.5 cm, transmural. Additional masses at the upper uterus measure subserosal 4.0 x 2.3 x 4.0 cm and intramural 2.4 x 1.7 x 2.2 cm. Endometrium Thickness: 17 mm at upper uterine segment. Obscured at mid uterus. No endometrial fluid Right ovary Measurements: 3.2 x 3.0 x 2.0 cm = volume: 10.2 mL. Normal morphology without mass. Internal blood flow present on color Doppler imaging. Left ovary Measurements: 3.2 x 2.2 x 3.0 cm = volume: 11.0 mL. Normal morphology without mass. Internal blood flow present on color Doppler imaging. Pulsed Doppler evaluation of both ovaries demonstrates normal low-resistance arterial and venous waveforms. Other  findings No free pelvic fluid.  No adnexal masses. IMPRESSION: Enlarged uterus containing multiple uterine leiomyomata. Otherwise negative exam. No evidence of ovarian torsion. Electronically Signed   By: Lavonia Dana M.D.   On: 10/31/2019 14:03    Procedures Procedures (including critical care time)  Medications Ordered in ED Medications  morphine 4 MG/ML injection 4 mg (4 mg Intravenous Given 10/31/19 1535)  acetaminophen (TYLENOL) tablet 650 mg (650 mg Oral Given 10/31/19 1231)  iohexol (OMNIPAQUE) 300 MG/ML solution 100 mL (100 mLs Intravenous Contrast Given 10/31/19 1516)  ondansetron (ZOFRAN) injection 4 mg (4 mg Intravenous Given 10/31/19 1535)  sodium chloride 0.9 % bolus 500 mL (0 mLs Intravenous Stopped 10/31/19 1648)  HYDROmorphone (DILAUDID) injection 0.5 mg (0.5 mg Intravenous Given 10/31/19 1639)    ED Course  I have reviewed the triage vital signs and the nursing notes.  Pertinent labs & imaging results that were available during my care of the patient were reviewed by me and considered in my medical decision making (see chart for details).    MDM Rules/Calculators/A&P                         Additional history obtained from: 1. Nursing notes from this visit. 2. Review of electronic medical record system. ------------------------------------- A 34 year old female presented for right lower quadrant pain onset 2 AM this morning.  Pain is been intermittent associated with some nausea.  No infectious type symptoms.  She is mildly tender in the right lower quadrant no rebound or guarding.  She refused pelvic examination denies any concern for STI or abnormal vaginal discharge or bleeding.  Concern on initial evaluation given the intermittent nature of her pain is for a ovarian torsion.  Discussed plan of care with patient, shared decision making made she is agreeable for pelvic ultrasound with torsion rule out and basic labs. - CBC within normal limits, no leukocytosis to  suggest infection and no anemia. Lipase within normal limits doubt pancreatitis. Pregnancy test negative, doubt ectopic. CMP shows glucose 112 otherwise within normal limits; no emergent electrolyte derangement, AKI, LFT elevations or gap. Urinalysis shows some ketones and protein suspicious for dehydration no evidence of infection.  Pelvic ultrasound  with Doppler:  IMPRESSION:  Enlarged uterus containing multiple uterine leiomyomata.    Otherwise negative exam.    No evidence of ovarian torsion.    - Patient reevaluated, she has continued right lower quadrant pain, she now accepts morphine for pain she had some nausea so Zofran was given.  Other considerations of right lower quadrant pain were considered, there is concern for appendicitis CT abdomen pelvis was ordered now that torsion study is negative. - CTAP:    IMPRESSION:  Normal appendix.    Bulky, fibroid uterus. Likely associated mild hydroureteronephrosis.  - Discussed case with Dr. Rogene Houston, suspect patient symptoms today are secondary to fibroid pain.  Patient reports that she is due to start her menstrual cycle soon which may be exacerbating some the pain.  She may have some mild hydronephrosis caused by fibroids but no evidence of AKI or UTI.  Possibly positional? She will need follow-up with OB/GYN.  I discussed all studies and imaging in detail with patient and her mother who are at bedside they are agreeable for discharge and outpatient OB/GYN follow-up, patient has an OB/GYN and plans to call them today before 5 PM.  Patient's mother is here to drive home, 1 more dose of pain medication given prior to leaving ER.  She will use NSAIDs and warm compresses at home.  At this time there does not appear to be any evidence of an acute emergency medical condition and the patient appears stable for discharge with appropriate outpatient follow up. Diagnosis was discussed with patient who verbalizes understanding of care plan and  is agreeable to discharge. I have discussed return precautions with patient and mother who verbalizes understanding. Patient encouraged to follow-up with their PCP and OBGYN. All questions answered.  Patient's case discussed with Dr. Rogene Houston who agrees with plan to discharge with follow-up.   Note: Portions of this report may have been transcribed using voice recognition software. Every effort was made to ensure accuracy; however, inadvertent computerized transcription errors may still be present. Final Clinical Impression(s) / ED Diagnoses Final diagnoses:  Uterine leiomyoma, unspecified location    Rx / DC Orders ED Discharge Orders    None       Gari Crown 10/31/19 1648    Fredia Sorrow, MD 11/01/19 [id number]

## 2019-10-31 NOTE — ED Triage Notes (Addendum)
Pt reports RLQ pain since early this am with some nausea, pain better upon standing. Denies urinary symptoms or diarrhea. Hx of ovarian cysts.

## 2019-10-31 NOTE — ED Notes (Signed)
Off floor to US.

## 2019-10-31 NOTE — Discharge Instructions (Addendum)
At this time there does not appear to be the presence of an emergent medical condition, however there is always the potential for conditions to change. Please read and follow the below instructions.  Please return to the Emergency Department immediately for any new or worsening symptoms. Please be sure to follow up with your Primary Care Provider within one week regarding your visit today; please call their office to schedule an appointment even if you are feeling better for a follow-up visit. Please call your OB/GYN today to schedule a follow-up appointment regarding your fibroids.  As we discussed your fibroids are very large and could be causing some backup of urine, please stay in a position of comfort to help with your symptoms.  Please take Ibuprofen (Advil, motrin) and Tylenol (acetaminophen) to relieve your pain.  You may take up to 600 MG (3 pills) of normal strength ibuprofen every 8 hours as needed.  In between doses of ibuprofen you make take tylenol, up to 1,000 mg (two extra strength pills).  Do not take more than 3,000 mg tylenol in a 24 hour period.  Please check all medication labels as many medications such as pain and cold medications may contain tylenol.  Do not drink alcohol while taking these medications.  Do not take other NSAID'S while taking ibuprofen (such as aleve or naproxen).  Please take ibuprofen with food to decrease stomach upset. Drink plenty of water and get plenty of rest.  You received pain medication in the ER which may make you drowsy.  Do not drive, drink alcohol or perform any dangerous activities for the rest of the day.  Go to the nearest Emergency Department immediately if: You have fever or chills Pass out (faint). Have pain in the area between your hip bones that suddenly gets worse. Have bleeding that soaks a tampon or pad in 30 minutes or less. You have severe flank or abdominal pain. You cannot drink fluids without vomiting. You have any new/concerning  or worsening of symptoms  Please read the additional information packets attached to your discharge summary.  Do not take your medicine if  develop an itchy rash, swelling in your mouth or lips, or difficulty breathing; call 911 and seek immediate emergency medical attention if this occurs.  You may review your lab tests and imaging results in their entirety on your MyChart account.  Please discuss all results of fully with your primary care provider and other specialist at your follow-up visit.  Note: Portions of this text may have been transcribed using voice recognition software. Every effort was made to ensure accuracy; however, inadvertent computerized transcription errors may still be present.

## 2019-11-05 ENCOUNTER — Other Ambulatory Visit: Payer: Self-pay | Admitting: Obstetrics and Gynecology

## 2019-11-05 DIAGNOSIS — D259 Leiomyoma of uterus, unspecified: Secondary | ICD-10-CM

## 2019-11-24 ENCOUNTER — Ambulatory Visit
Admission: RE | Admit: 2019-11-24 | Discharge: 2019-11-24 | Disposition: A | Discharge status: Home / Self Care | Payer: BC Managed Care – PPO | Source: Ambulatory Visit | Attending: Obstetrics and Gynecology | Admitting: Obstetrics and Gynecology

## 2019-11-24 DIAGNOSIS — D259 Leiomyoma of uterus, unspecified: Secondary | ICD-10-CM

## 2019-11-24 MED ORDER — GADOBENATE DIMEGLUMINE 529 MG/ML IV SOLN
13.0000 mL | Freq: Once | INTRAVENOUS | Status: AC | PRN
  Administered 2019-11-24: 13 mL via INTRAVENOUS

## 2019-12-31 ENCOUNTER — Other Ambulatory Visit: Payer: Self-pay | Admitting: Obstetrics and Gynecology

## 2019-12-31 DIAGNOSIS — N979 Female infertility, unspecified: Secondary | ICD-10-CM

## 2020-01-03 ENCOUNTER — Other Ambulatory Visit: Payer: Self-pay | Admitting: Obstetrics and Gynecology

## 2020-01-03 DIAGNOSIS — N979 Female infertility, unspecified: Secondary | ICD-10-CM

## 2020-01-03 DIAGNOSIS — N803 Endometriosis of pelvic peritoneum, unspecified: Secondary | ICD-10-CM

## 2020-01-07 ENCOUNTER — Other Ambulatory Visit: Payer: BC Managed Care – PPO

## 2020-01-07 ENCOUNTER — Ambulatory Visit
Admission: RE | Admit: 2020-01-07 | Discharge: 2020-01-07 | Disposition: A | Discharge status: Home / Self Care | Payer: BC Managed Care – PPO | Source: Ambulatory Visit | Attending: Obstetrics and Gynecology | Admitting: Obstetrics and Gynecology

## 2020-01-07 DIAGNOSIS — N979 Female infertility, unspecified: Secondary | ICD-10-CM

## 2020-02-08 ENCOUNTER — Inpatient Hospital Stay (HOSPITAL_COMMUNITY): Admission: RE | Admit: 2020-02-08 | Payer: BC Managed Care – PPO | Source: Ambulatory Visit

## 2020-02-14 ENCOUNTER — Ambulatory Visit (HOSPITAL_BASED_OUTPATIENT_CLINIC_OR_DEPARTMENT_OTHER): Admit: 2020-02-14 | Payer: BC Managed Care – PPO | Admitting: Obstetrics and Gynecology

## 2020-02-14 ENCOUNTER — Encounter (HOSPITAL_BASED_OUTPATIENT_CLINIC_OR_DEPARTMENT_OTHER): Payer: Self-pay

## 2020-02-14 SURGERY — MYOMECTOMY, ABDOMINAL APPROACH
Anesthesia: General

## 2020-05-14 ENCOUNTER — Encounter (HOSPITAL_BASED_OUTPATIENT_CLINIC_OR_DEPARTMENT_OTHER): Payer: Self-pay | Admitting: Obstetrics and Gynecology

## 2020-05-14 ENCOUNTER — Other Ambulatory Visit: Payer: Self-pay

## 2020-05-14 NOTE — Progress Notes (Signed)
Spoke w/ via phone for pre-op interview---pt Lab results: has lab appt 05-16-2020 900 for cbc and t & s Lab needs dos---urine poct Arrive at -------530 am 05-20-2020 NPO after MN NO Solid Food.  Clear liquids from MN until---430 am then npo Med rec completed Medications to take morning of surgery -----none Diabetic medication -----n/aPatient instructed to bring photo id and insurance card day of surgery Patient aware to have Driver (ride ) / caregiver mother leticia Duty will stay    for 24 hours after surgery  Patient Special Instructions -----pt states she is 1 night stay per dr Helane Rima, pt given overnight stay instructions Pre-Op special Istructions -----none Patient verbalized understanding of instructions that were given at this phone interview. Patient denies shortness of breath, chest pain, fever, cough at this phone interview.

## 2020-05-16 ENCOUNTER — Other Ambulatory Visit (HOSPITAL_COMMUNITY)
Admission: RE | Admit: 2020-05-16 | Discharge: 2020-05-16 | Disposition: A | Discharge status: Home / Self Care | Payer: BC Managed Care – PPO | Source: Ambulatory Visit | Attending: Obstetrics and Gynecology | Admitting: Obstetrics and Gynecology

## 2020-05-16 ENCOUNTER — Encounter (HOSPITAL_COMMUNITY)
Admission: RE | Admit: 2020-05-16 | Discharge: 2020-05-16 | Disposition: A | Discharge status: Home / Self Care | Payer: BC Managed Care – PPO | Source: Ambulatory Visit | Attending: Obstetrics and Gynecology | Admitting: Obstetrics and Gynecology

## 2020-05-16 ENCOUNTER — Other Ambulatory Visit: Payer: Self-pay

## 2020-05-16 DIAGNOSIS — Z20822 Contact with and (suspected) exposure to covid-19: Secondary | ICD-10-CM | POA: Diagnosis not present

## 2020-05-16 DIAGNOSIS — Z01812 Encounter for preprocedural laboratory examination: Secondary | ICD-10-CM | POA: Diagnosis present

## 2020-05-16 LAB — CBC
HCT: 39 % (ref 36.0–46.0)
Hemoglobin: 12.5 g/dL (ref 12.0–15.0)
MCH: 28.3 pg (ref 26.0–34.0)
MCHC: 32.1 g/dL (ref 30.0–36.0)
MCV: 88.2 fL (ref 80.0–100.0)
Platelets: 211 10*3/uL (ref 150–400)
RBC: 4.42 MIL/uL (ref 3.87–5.11)
RDW: 13.5 % (ref 11.5–15.5)
WBC: 5 10*3/uL (ref 4.0–10.5)
nRBC: 0 % (ref 0.0–0.2)

## 2020-05-17 LAB — SARS CORONAVIRUS 2 (TAT 6-24 HRS): SARS Coronavirus 2: NEGATIVE

## 2020-05-19 NOTE — Anesthesia Preprocedure Evaluation (Addendum)
Anesthesia Evaluation  Patient identified by MRN, date of birth, ID band Patient awake    Reviewed: Allergy & Precautions, NPO status , Patient's Chart, lab work & pertinent test results  History of Anesthesia Complications Negative for: history of anesthetic complications  Airway Mallampati: I  TM Distance: >3 FB Neck ROM: Full    Dental no notable dental hx.    Pulmonary neg pulmonary ROS,    Pulmonary exam normal        Cardiovascular negative cardio ROS Normal cardiovascular exam     Neuro/Psych negative neurological ROS  negative psych ROS   GI/Hepatic negative GI ROS, Neg liver ROS,   Endo/Other  negative endocrine ROS  Renal/GU negative Renal ROS  negative genitourinary   Musculoskeletal negative musculoskeletal ROS (+)   Abdominal   Peds  Hematology negative hematology ROS (+)   Anesthesia Other Findings Day of surgery medications reviewed with patient.  Reproductive/Obstetrics Uterine fibroids                            Anesthesia Physical Anesthesia Plan  ASA: I  Anesthesia Plan: General   Post-op Pain Management:    Induction: Intravenous  PONV Risk Score and Plan: 4 or greater and Treatment may vary due to age or medical condition, Ondansetron, Dexamethasone, Midazolam and Scopolamine patch - Pre-op  Airway Management Planned: Oral ETT  Additional Equipment: None  Intra-op Plan:   Post-operative Plan: Extubation in OR  Informed Consent: I have reviewed the patients History and Physical, chart, labs and discussed the procedure including the risks, benefits and alternatives for the proposed anesthesia with the patient or authorized representative who has indicated his/her understanding and acceptance.     Dental advisory given  Plan Discussed with: CRNA  Anesthesia Plan Comments:        Anesthesia Quick Evaluation

## 2020-05-20 ENCOUNTER — Ambulatory Visit (HOSPITAL_BASED_OUTPATIENT_CLINIC_OR_DEPARTMENT_OTHER)
Admission: RE | Admit: 2020-05-20 | Discharge: 2020-05-21 | Disposition: A | Discharge status: Home / Self Care | Payer: BC Managed Care – PPO | Attending: Obstetrics and Gynecology | Admitting: Obstetrics and Gynecology

## 2020-05-20 ENCOUNTER — Encounter (HOSPITAL_BASED_OUTPATIENT_CLINIC_OR_DEPARTMENT_OTHER)
Admission: RE | Disposition: A | Discharge status: Home / Self Care | Payer: Self-pay | Source: Home / Self Care | Attending: Obstetrics and Gynecology

## 2020-05-20 ENCOUNTER — Encounter (HOSPITAL_BASED_OUTPATIENT_CLINIC_OR_DEPARTMENT_OTHER): Payer: Self-pay | Admitting: Obstetrics and Gynecology

## 2020-05-20 ENCOUNTER — Inpatient Hospital Stay (HOSPITAL_BASED_OUTPATIENT_CLINIC_OR_DEPARTMENT_OTHER): Payer: BC Managed Care – PPO | Admitting: Anesthesiology

## 2020-05-20 DIAGNOSIS — Z79899 Other long term (current) drug therapy: Secondary | ICD-10-CM | POA: Insufficient documentation

## 2020-05-20 DIAGNOSIS — N736 Female pelvic peritoneal adhesions (postinfective): Secondary | ICD-10-CM | POA: Diagnosis not present

## 2020-05-20 DIAGNOSIS — N946 Dysmenorrhea, unspecified: Secondary | ICD-10-CM | POA: Diagnosis not present

## 2020-05-20 DIAGNOSIS — D259 Leiomyoma of uterus, unspecified: Secondary | ICD-10-CM | POA: Diagnosis not present

## 2020-05-20 DIAGNOSIS — N83202 Unspecified ovarian cyst, left side: Secondary | ICD-10-CM | POA: Insufficient documentation

## 2020-05-20 DIAGNOSIS — D219 Benign neoplasm of connective and other soft tissue, unspecified: Secondary | ICD-10-CM | POA: Diagnosis present

## 2020-05-20 DIAGNOSIS — N920 Excessive and frequent menstruation with regular cycle: Secondary | ICD-10-CM | POA: Insufficient documentation

## 2020-05-20 HISTORY — PX: MYOMECTOMY: SHX85

## 2020-05-20 LAB — POCT PREGNANCY, URINE: Preg Test, Ur: NEGATIVE

## 2020-05-20 LAB — TYPE AND SCREEN
ABO/RH(D): AB POS
Antibody Screen: NEGATIVE

## 2020-05-20 LAB — ABO/RH: ABO/RH(D): AB POS

## 2020-05-20 SURGERY — MYOMECTOMY, ABDOMINAL APPROACH
Anesthesia: General | Site: Abdomen | Laterality: Left

## 2020-05-20 MED ORDER — SODIUM CHLORIDE 0.9 % IV SOLN
510.0000 mg | Freq: Once | INTRAVENOUS | Status: AC
  Administered 2020-05-20: 510 mg via INTRAVENOUS
  Filled 2020-05-20: qty 510

## 2020-05-20 MED ORDER — ACETAMINOPHEN 500 MG PO TABS
ORAL_TABLET | ORAL | Status: AC
  Filled 2020-05-20: qty 2

## 2020-05-20 MED ORDER — BUPIVACAINE HCL (PF) 0.25 % IJ SOLN
INTRAMUSCULAR | Status: DC | PRN
  Administered 2020-05-20: 10 mL

## 2020-05-20 MED ORDER — LACTATED RINGERS IV BOLUS
1000.0000 mL | Freq: Once | INTRAVENOUS | Status: AC
  Administered 2020-05-20: 1000 mL via INTRAVENOUS

## 2020-05-20 MED ORDER — MIDAZOLAM HCL 2 MG/2ML IJ SOLN
INTRAMUSCULAR | Status: AC
  Filled 2020-05-20: qty 2

## 2020-05-20 MED ORDER — OXYCODONE HCL 5 MG PO TABS
5.0000 mg | ORAL_TABLET | ORAL | Status: DC | PRN
  Administered 2020-05-20 – 2020-05-21 (×5): 5 mg via ORAL

## 2020-05-20 MED ORDER — ACETAMINOPHEN 500 MG PO TABS
1000.0000 mg | ORAL_TABLET | Freq: Once | ORAL | Status: AC
  Administered 2020-05-20: 1000 mg via ORAL

## 2020-05-20 MED ORDER — FENTANYL CITRATE (PF) 100 MCG/2ML IJ SOLN
INTRAMUSCULAR | Status: AC
  Filled 2020-05-20: qty 2

## 2020-05-20 MED ORDER — ONDANSETRON HCL 4 MG PO TABS: 4.0000 mg | ORAL_TABLET | Freq: Four times a day (QID) | ORAL | Status: DC | PRN

## 2020-05-20 MED ORDER — SIMETHICONE 80 MG PO CHEW: 80.0000 mg | CHEWABLE_TABLET | Freq: Four times a day (QID) | ORAL | Status: DC | PRN

## 2020-05-20 MED ORDER — IBUPROFEN 200 MG PO TABS
ORAL_TABLET | ORAL | Status: AC
  Filled 2020-05-20: qty 3

## 2020-05-20 MED ORDER — LACTATED RINGERS IV SOLN: INTRAVENOUS | Status: DC

## 2020-05-20 MED ORDER — IBUPROFEN 800 MG PO TABS
ORAL_TABLET | ORAL | Status: AC
  Filled 2020-05-20: qty 1

## 2020-05-20 MED ORDER — LIDOCAINE 2% (20 MG/ML) 5 ML SYRINGE
INTRAMUSCULAR | Status: AC
  Filled 2020-05-20: qty 5

## 2020-05-20 MED ORDER — SCOPOLAMINE 1 MG/3DAYS TD PT72
1.0000 | MEDICATED_PATCH | Freq: Once | TRANSDERMAL | Status: DC
  Administered 2020-05-20: 1.5 mg via TRANSDERMAL

## 2020-05-20 MED ORDER — FENTANYL CITRATE (PF) 100 MCG/2ML IJ SOLN
INTRAMUSCULAR | Status: DC | PRN
  Administered 2020-05-20: 25 ug via INTRAVENOUS
  Administered 2020-05-20: 100 ug via INTRAVENOUS
  Administered 2020-05-20: 25 ug via INTRAVENOUS
  Administered 2020-05-20: 50 ug via INTRAVENOUS

## 2020-05-20 MED ORDER — LIDOCAINE HCL (CARDIAC) PF 100 MG/5ML IV SOSY
PREFILLED_SYRINGE | INTRAVENOUS | Status: DC | PRN
  Administered 2020-05-20: 80 mg via INTRAVENOUS

## 2020-05-20 MED ORDER — ROCURONIUM BROMIDE 100 MG/10ML IV SOLN
INTRAVENOUS | Status: DC | PRN
  Administered 2020-05-20: 5 mg via INTRAVENOUS
  Administered 2020-05-20: 40 mg via INTRAVENOUS
  Administered 2020-05-20: 10 mg via INTRAVENOUS

## 2020-05-20 MED ORDER — SCOPOLAMINE 1 MG/3DAYS TD PT72
MEDICATED_PATCH | TRANSDERMAL | Status: AC
  Filled 2020-05-20: qty 1

## 2020-05-20 MED ORDER — OXYCODONE HCL 5 MG/5ML PO SOLN: 5.0000 mg | Freq: Once | ORAL | Status: DC | PRN

## 2020-05-20 MED ORDER — PHENYLEPHRINE HCL (PRESSORS) 10 MG/ML IV SOLN
INTRAVENOUS | Status: DC | PRN
  Administered 2020-05-20 (×2): 120 ug via INTRAVENOUS
  Administered 2020-05-20: 80 ug via INTRAVENOUS

## 2020-05-20 MED ORDER — ONDANSETRON HCL 4 MG/2ML IJ SOLN: 4.0000 mg | Freq: Four times a day (QID) | INTRAMUSCULAR | Status: DC | PRN

## 2020-05-20 MED ORDER — PROMETHAZINE HCL 25 MG/ML IJ SOLN: 6.2500 mg | INTRAMUSCULAR | Status: DC | PRN

## 2020-05-20 MED ORDER — FENTANYL CITRATE (PF) 250 MCG/5ML IJ SOLN
INTRAMUSCULAR | Status: AC
  Filled 2020-05-20: qty 5

## 2020-05-20 MED ORDER — OXYCODONE HCL 5 MG PO TABS
ORAL_TABLET | ORAL | Status: AC
  Filled 2020-05-20: qty 1

## 2020-05-20 MED ORDER — GLYCOPYRROLATE 0.2 MG/ML IJ SOLN
INTRAMUSCULAR | Status: DC | PRN
  Administered 2020-05-20: .1 mg via INTRAVENOUS

## 2020-05-20 MED ORDER — TRAMADOL HCL 50 MG PO TABS: 50.0000 mg | ORAL_TABLET | Freq: Four times a day (QID) | ORAL | Status: DC | PRN

## 2020-05-20 MED ORDER — OXYCODONE HCL 5 MG PO TABS
5.0000 mg | ORAL_TABLET | Freq: Once | ORAL | Status: DC | PRN
Start: 2020-05-20 — End: 2020-05-21

## 2020-05-20 MED ORDER — HYDROMORPHONE HCL 1 MG/ML IJ SOLN: 0.2000 mg | INTRAMUSCULAR | Status: DC | PRN

## 2020-05-20 MED ORDER — IBUPROFEN 200 MG PO TABS
600.0000 mg | ORAL_TABLET | Freq: Four times a day (QID) | ORAL | Status: DC
  Administered 2020-05-20 (×3): 600 mg via ORAL
  Administered 2020-05-21: 400 mg via ORAL

## 2020-05-20 MED ORDER — POVIDONE-IODINE 10 % EX SWAB: 2.0000 "application " | Freq: Once | CUTANEOUS | Status: DC

## 2020-05-20 MED ORDER — SUGAMMADEX SODIUM 200 MG/2ML IV SOLN
INTRAVENOUS | Status: DC | PRN
  Administered 2020-05-20: 130 mg via INTRAVENOUS

## 2020-05-20 MED ORDER — DEXAMETHASONE SODIUM PHOSPHATE 4 MG/ML IJ SOLN
INTRAMUSCULAR | Status: DC | PRN
  Administered 2020-05-20: 8 mg via INTRAVENOUS

## 2020-05-20 MED ORDER — MENTHOL 3 MG MT LOZG: 1.0000 | LOZENGE | OROMUCOSAL | Status: DC | PRN

## 2020-05-20 MED ORDER — ALBUMIN HUMAN 5 % IV SOLN: INTRAVENOUS | Status: DC | PRN

## 2020-05-20 MED ORDER — MIDAZOLAM HCL 2 MG/2ML IJ SOLN
INTRAMUSCULAR | Status: DC | PRN
  Administered 2020-05-20: 1 mg via INTRAVENOUS

## 2020-05-20 MED ORDER — PROPOFOL 10 MG/ML IV BOLUS
INTRAVENOUS | Status: AC
  Filled 2020-05-20: qty 40

## 2020-05-20 MED ORDER — ONDANSETRON HCL 4 MG/2ML IJ SOLN
INTRAMUSCULAR | Status: DC | PRN
  Administered 2020-05-20: 4 mg via INTRAVENOUS

## 2020-05-20 MED ORDER — SODIUM CHLORIDE 0.9 % IV SOLN
INTRAVENOUS | Status: AC
  Filled 2020-05-20: qty 2

## 2020-05-20 MED ORDER — FENTANYL CITRATE (PF) 100 MCG/2ML IJ SOLN
25.0000 ug | INTRAMUSCULAR | Status: DC | PRN
  Administered 2020-05-20 (×2): 25 ug via INTRAVENOUS

## 2020-05-20 MED ORDER — SODIUM CHLORIDE 0.9 % IV SOLN
2.0000 g | INTRAVENOUS | Status: AC
  Administered 2020-05-20: 2 g via INTRAVENOUS

## 2020-05-20 MED ORDER — PROPOFOL 10 MG/ML IV BOLUS
INTRAVENOUS | Status: DC | PRN
  Administered 2020-05-20: 120 mg via INTRAVENOUS

## 2020-05-20 SURGICAL SUPPLY — 53 items
ADH SKN CLS APL DERMABOND .7 (GAUZE/BANDAGES/DRESSINGS) ×1
BARRIER ADHS 3X4 INTERCEED (GAUZE/BANDAGES/DRESSINGS) ×4 IMPLANT
BLADE EXTENDED COATED 6.5IN (ELECTRODE) IMPLANT
BLADE HEX COATED 2.75 (ELECTRODE) ×2 IMPLANT
BRR ADH 4X3 ABS CNTRL BYND (GAUZE/BANDAGES/DRESSINGS) ×2
BRR ADH 6X5 SEPRAFILM 1 SHT (MISCELLANEOUS)
COVER MAYO STAND STRL (DRAPES) ×2 IMPLANT
COVER WAND RF STERILE (DRAPES) ×2 IMPLANT
DECANTER SPIKE VIAL GLASS SM (MISCELLANEOUS) IMPLANT
DERMABOND ADVANCED (GAUZE/BANDAGES/DRESSINGS) ×1
DERMABOND ADVANCED .7 DNX12 (GAUZE/BANDAGES/DRESSINGS) ×1 IMPLANT
DRAPE LAPAROTOMY TRNSV 102X78 (DRAPES) ×2 IMPLANT
DRAPE WARM FLUID 44X44 (DRAPES) ×2 IMPLANT
DRSG OPSITE POSTOP 3X4 (GAUZE/BANDAGES/DRESSINGS) ×2 IMPLANT
DRSG OPSITE POSTOP 4X10 (GAUZE/BANDAGES/DRESSINGS) ×2 IMPLANT
DRSG TEGADERM 4X4.75 (GAUZE/BANDAGES/DRESSINGS) ×2 IMPLANT
DURAPREP 26ML APPLICATOR (WOUND CARE) ×2 IMPLANT
GAUZE 4X4 16PLY RFD (DISPOSABLE) ×2 IMPLANT
GAUZE SPONGE 4X4 12PLY STRL (GAUZE/BANDAGES/DRESSINGS) IMPLANT
GLOVE SURG ENC MOIS LTX SZ6 (GLOVE) ×2 IMPLANT
GLOVE SURG ENC MOIS LTX SZ6.5 (GLOVE) ×2 IMPLANT
GLOVE SURG UNDER POLY LF SZ6 (GLOVE) ×2 IMPLANT
GLOVE SURG UNDER POLY LF SZ7 (GLOVE) ×14 IMPLANT
GOWN STRL REUS W/TWL LRG LVL3 (GOWN DISPOSABLE) ×6 IMPLANT
HEMOSTAT ARISTA ABSORB 3G PWDR (HEMOSTASIS) ×2 IMPLANT
HOLDER FOLEY CATH W/STRAP (MISCELLANEOUS) ×2 IMPLANT
KIT TURNOVER CYSTO (KITS) ×2 IMPLANT
MANIFOLD NEPTUNE II (INSTRUMENTS) ×2 IMPLANT
NEEDLE HYPO 22GX1.5 SAFETY (NEEDLE) ×2 IMPLANT
NS IRRIG 1000ML POUR BTL (IV SOLUTION) ×4 IMPLANT
NS IRRIG 500ML POUR BTL (IV SOLUTION) ×2 IMPLANT
PACK ABDOMINAL GYN (CUSTOM PROCEDURE TRAY) IMPLANT
PAD OB MATERNITY 4.3X12.25 (PERSONAL CARE ITEMS) ×2 IMPLANT
SEPRAFILM MEMBRANE 5X6 (MISCELLANEOUS) IMPLANT
SPONGE LAP 18X18 RF (DISPOSABLE) ×6 IMPLANT
SPONGE LAP 4X18 RFD (DISPOSABLE) IMPLANT
STAPLER VISISTAT 35W (STAPLE) IMPLANT
STRIP CLOSURE SKIN 1/2X4 (GAUZE/BANDAGES/DRESSINGS) ×2 IMPLANT
SUT PDS AB 0 CT 36 (SUTURE) IMPLANT
SUT PDS AB 0 CTX 60 (SUTURE) IMPLANT
SUT PLAIN 2 0 XLH (SUTURE) IMPLANT
SUT VIC AB 0 CT1 18XCR BRD8 (SUTURE) ×2 IMPLANT
SUT VIC AB 0 CT1 27 (SUTURE) ×8
SUT VIC AB 0 CT1 27XBRD ANBCTR (SUTURE) ×4 IMPLANT
SUT VIC AB 0 CT1 8-18 (SUTURE) ×4
SUT VIC AB 3-0 PS1 18 (SUTURE) ×2
SUT VIC AB 3-0 PS1 18X BRD (SUTURE) ×1 IMPLANT
SUT VIC AB 4-0 KS 27 (SUTURE) ×2 IMPLANT
SUT VICRYL 0 TIES 12 18 (SUTURE) ×2 IMPLANT
SYR BULB IRRIG 60ML STRL (SYRINGE) ×2 IMPLANT
SYR CONTROL 10ML LL (SYRINGE) ×2 IMPLANT
TOWEL OR 17X26 10 PK STRL BLUE (TOWEL DISPOSABLE) ×4 IMPLANT
TRAY FOLEY W/BAG SLVR 14FR LF (SET/KITS/TRAYS/PACK) ×2 IMPLANT

## 2020-05-20 NOTE — Anesthesia Procedure Notes (Signed)
Procedure Name: Intubation Date/Time: 05/20/2020 7:33 AM Performed by: Georgeanne Nim, CRNA Pre-anesthesia Checklist: Patient identified, Emergency Drugs available, Suction available, Patient being monitored and Timeout performed Patient Re-evaluated:Patient Re-evaluated prior to induction Oxygen Delivery Method: Circle system utilized Preoxygenation: Pre-oxygenation with 100% oxygen Induction Type: IV induction Ventilation: Mask ventilation without difficulty Laryngoscope Size: Mac and 4 Grade View: Grade I Tube size: 7.0 mm Number of attempts: 1 Airway Equipment and Method: Stylet Placement Confirmation: ETT inserted through vocal cords under direct vision,  positive ETCO2,  CO2 detector and breath sounds checked- equal and bilateral Secured at: 19 cm Tube secured with: Tape Dental Injury: Teeth and Oropharynx as per pre-operative assessment

## 2020-05-20 NOTE — Anesthesia Postprocedure Evaluation (Signed)
Anesthesia Post Note  Patient: Water quality scientist  Procedure(s) Performed: ABDOMINAL MYOMECTOMIES, LYSIS OF ADHESION, DRAINAGE OF LEFT OVARIAN ENDOMETRIOMA (Left Abdomen)     Patient location during evaluation: PACU Anesthesia Type: General Level of consciousness: awake and alert and oriented Pain management: pain level controlled Vital Signs Assessment: post-procedure vital signs reviewed and stable Respiratory status: spontaneous breathing, nonlabored ventilation and respiratory function stable Cardiovascular status: blood pressure returned to baseline Postop Assessment: no apparent nausea or vomiting Anesthetic complications: no   No complications documented.  Last Vitals:  Vitals:   05/20/20 1000 05/20/20 1030  BP: (!) 103/51   Pulse: 67 75  Resp: 16 15  Temp: 36.5 C 37.8 C  SpO2: 100% 100%    Last Pain:  Vitals:   05/20/20 1030  TempSrc:   PainSc: [place]

## 2020-05-20 NOTE — Transfer of Care (Signed)
Immediate Anesthesia Transfer of Care Note  Patient: Kristina Joyce  Procedure(s) Performed: ABDOMINAL MYOMECTOMIES, LYSIS OF ADHESION, DRAINAGE OF LEFT OVARIAN ENDOMETRIOMA (Left Abdomen)  Patient Location: PACU  Anesthesia Type:General  Level of Consciousness: awake, alert , oriented and patient cooperative  Airway & Oxygen Therapy: Patient Spontanous Breathing and Patient connected to nasal cannula oxygen  Post-op Assessment: Report given to RN and Post -op Vital signs reviewed and stable  Post vital signs: Reviewed and stable  Last Vitals:  Vitals Value Taken Time  BP    Temp 36.6 C 05/20/20 0856  Pulse    Resp    SpO2      Last Pain:  Vitals:   05/20/20 0607  TempSrc: Oral  PainSc: 0-No pain      Patients Stated Pain Goal: 5 (42/68/34 1962)  Complications: No complications documented.

## 2020-05-20 NOTE — Progress Notes (Signed)
Patient became diaphoretic and BP dropped she also became faint.  She was taken back to her room via stretcher.  Dr Helane Rima notified.  See orders.

## 2020-05-20 NOTE — Brief Op Note (Signed)
05/20/2020  8:50 AM  PATIENT:  Kristina Joyce  35 y.o. female  PRE-OPERATIVE DIAGNOSIS:   Fibroids  Possible left ovarian endometrioma POST-OPERATIVE DIAGNOSIS:   Abdominal myomectomies Lysis of adhesions Drainage of left ovarian cyst   PROCEDURE:  Procedure(s): ABDOMINAL MYOMECTOMIES, LYSIS OF ADHESION, DRAINAGE OF LEFT OVARIAN ENDOMETRIOMA (Left)  SURGEON:  Surgeon(s) and Role:    * Dian Queen, MD - Primary    * Morris, Megan, DO - Assisting  PHYSICIAN ASSISTANT:   ASSISTANTS: none   ANESTHESIA:   general  EBL:  500 mL   BLOOD ADMINISTERED:none  DRAINS: Urinary Catheter (Foley)   LOCAL MEDICATIONS USED:  NONE  SPECIMEN:  Source of Specimen:  fibroids  DISPOSITION OF SPECIMEN:  PATHOLOGY  COUNTS:  YES  TOURNIQUET:  * No tourniquets in log *  DICTATION: .Other Dictation: Dictation Number dictated  PLAN OF CARE: Admit to inpatient   PATIENT DISPOSITION:  PACU - hemodynamically stable.   Delay start of Pharmacological VTE agent (>24hrs) due to surgical blood loss or risk of bleeding: not applicable

## 2020-05-20 NOTE — H&P (Signed)
  35 year old G 0 with fibroids, symptomatic and probable left ovarian endometrioma She has been experiencing menorrhagia, dysmenorrhea  Past Medical History:  Diagnosis Date  . Abnormal Pap smear   . Allergy   . Candida vaginitis   . Chlamydia 2007  . Dysplasia of cervix, low grade (CIN 1)   . HGSIL (high grade squamous intraepithelial dysplasia)   . HSV-1 infection   . Oligo-ovulation    Past Surgical History:  Procedure Laterality Date  . acl repair Left 2016  . WISDOM TOOTH EXTRACTION  yrs ago   Patient has no known allergies.  . Prior to Admission medications   Medication Sig Start Date End Date Taking? Authorizing Provider  Acetaminophen (MIDOL PO) Take by mouth as needed.   Yes [provider]  cetirizine (ZYRTEC) 10 MG tablet Take 10 mg by mouth daily.   Yes [provider]  ibuprofen (ADVIL) 200 MG tablet Take 200 mg by mouth every 6 (six) hours as needed. Tales 2 of 200 mg prn   Yes [provider]  Maca Root (MACA PO) Take by mouth.   Yes [provider]  Turmeric (QC TUMERIC COMPLEX PO) Take by mouth.   Yes [provider]   BP 113/70   Pulse 72   Temp 98.7 F (37.1 C) (Oral)   Resp 16   Ht 5\' 1"  (1.549 m)   Wt 65.5 kg   LMP 04/29/2020   SpO2 100%   BMI 27.27 kg/m  Family History  Problem Relation Age of Onset  . Cancer Maternal Aunt 57       Breast cancer  . Hypertension Maternal Grandmother   . Diabetes Maternal Grandmother   . Cancer Cousin 29       Breast Cancer   Social History   Socioeconomic History  . Marital status: Single    Spouse name: n/a  . Number of children: 0  . Years of education: 95  . Highest education level: Not on file  Occupational History  . Occupation: Architectural technologist    Comment: children with Armed forces logistics/support/administrative officer and literacy  . Occupation: Charity fundraiser    Comment: part-time  Tobacco Use  . Smoking status: Never Smoker  . Smokeless tobacco: Never Used   Vaping Use  . Vaping Use: Never used  Substance and Sexual Activity  . Alcohol use: Yes    Alcohol/week: 0.0 - 2.0 standard drinks    Comment: wine once a week 2 or 3 glasses  . Drug use: No  . Sexual activity: Not Currently    Partners: Male    Birth control/protection: Condom  Other Topics Concern  . Not on file  Social History Narrative   Lives with her parents.   Social Determinants of Health   Financial Resource Strain: Not on file  Food Insecurity: Not on file  Transportation Needs: Not on file  Physical Activity: Not on file  Stress: Not on file  Social Connections: Not on file   General alert and oriented Lung CTAB Car RRR Abdomen is soft and non tender Fibroid uterus   4 cm left ovarian endometrioma  IMPRESSION: Symptomatic fibroids  Left ovarian endometrioma  PLAN: Abdominal myomectomies  Removal of left ovarian endometrioma and possible fulguration of endometriosis

## 2020-05-21 DIAGNOSIS — D259 Leiomyoma of uterus, unspecified: Secondary | ICD-10-CM | POA: Diagnosis not present

## 2020-05-21 LAB — CBC
HCT: 25.3 % — ABNORMAL LOW (ref 36.0–46.0)
Hemoglobin: 8.5 g/dL — ABNORMAL LOW (ref 12.0–15.0)
MCH: 29.3 pg (ref 26.0–34.0)
MCHC: 33.6 g/dL (ref 30.0–36.0)
MCV: 87.2 fL (ref 80.0–100.0)
Platelets: 151 10*3/uL (ref 150–400)
RBC: 2.9 MIL/uL — ABNORMAL LOW (ref 3.87–5.11)
RDW: 13.8 % (ref 11.5–15.5)
WBC: 9.1 10*3/uL (ref 4.0–10.5)
nRBC: 0 % (ref 0.0–0.2)

## 2020-05-21 LAB — SURGICAL PATHOLOGY

## 2020-05-21 MED ORDER — OXYCODONE HCL 5 MG PO TABS
ORAL_TABLET | ORAL | Status: AC
  Filled 2020-05-21: qty 1

## 2020-05-21 MED ORDER — IBUPROFEN 600 MG PO TABS: 600.0000 mg | ORAL_TABLET | Freq: Four times a day (QID) | ORAL | 0 refills | Status: AC

## 2020-05-21 MED ORDER — IBUPROFEN 200 MG PO TABS
ORAL_TABLET | ORAL | Status: AC
  Filled 2020-05-21: qty 2

## 2020-05-21 MED ORDER — IBUPROFEN 200 MG PO TABS
ORAL_TABLET | ORAL | Status: AC
  Filled 2020-05-21: qty 1

## 2020-05-21 MED ORDER — OXYCODONE HCL 5 MG PO TABS: 5.0000 mg | ORAL_TABLET | ORAL | 0 refills | Status: AC | PRN

## 2020-05-21 NOTE — Discharge Summary (Signed)
Admission Diagnosis: Symptomatic Fibroids  Discharge Diagnosis: Same  Hospital Course: 35 year old G 0 with symptomatic fibroids admitted for abdominal myomectomies  On POD # 1 ambulating, voiding and tolerating regular diet.  BP 108/61 (BP Location: Left Arm)   Pulse (!) 103   Temp 98.5 F (36.9 C)   Resp 16   Ht 5\' 1"  (1.549 m)   Wt 65.5 kg   LMP 04/29/2020   SpO2 97%   BMI 27.27 kg/m  Results for orders placed or performed during the hospital encounter of 05/20/20 (from the past 24 hour(s))  CBC     Status: Abnormal   Collection Time: 05/21/20  2:25 AM  Result Value Ref Range   WBC 9.1 4.0 - 10.5 K/uL   RBC 2.90 (L) 3.87 - 5.11 MIL/uL   Hemoglobin 8.5 (L) 12.0 - 15.0 g/dL   HCT 25.3 (L) 36.0 - 46.0 %   MCV 87.2 80.0 - 100.0 fL   MCH 29.3 26.0 - 34.0 pg   MCHC 33.6 30.0 - 36.0 g/dL   RDW 13.8 11.5 - 15.5 %   Platelets 151 150 - 400 K/uL   nRBC 0.0 0.0 - 0.2 %   Abdomen soft and non tender Bandage clean and dry   Sent home with ibuprofen and oxycodone and follow up in 1 week

## 2020-05-21 NOTE — Discharge Instructions (Addendum)
Myomectomy, Care After The following information offers guidance on how to care for yourself after your procedure. Your health care provider may also give you more specific instructions. If you have problems or questions, contact your health care provider. What can I expect after the procedure? After the procedure, it is common to have:  Pain in your abdomen and at the incision sites.  Vaginal bleeding. Follow these instructions at home: Medicines  Take over-the-counter and prescription medicines only as told by your health care provider.  If you were prescribed an antibiotic medicine, take it as told by your health care provider. Do not stop using the antibiotic even if you start to feel better.  If you are taking blood thinners: ? Talk with your health care provider before you take any medicines that contain aspirin or NSAIDs, such as ibuprofen. These medicines increase your risk for dangerous bleeding. ? Take your medicine exactly as told, at the same time every day. ? Avoid activities that could cause injury or bruising, and follow instructions about how to prevent falls. ? Wear a medical alert bracelet or carry a card that lists what medicines you take.  Ask your health care provider if the medicine prescribed to you: ? Requires you to avoid driving or using machinery. ? Can cause constipation. You may need to take these actions to prevent or treat constipation:  Drink enough fluid to keep your urine pale yellow.  Take over-the-counter or prescription medicines.  Eat foods that are high in fiber, such as beans, whole grains, and fresh fruits and vegetables.  Limit foods that are high in fat and processed sugars, such as fried or sweet foods. Incision care  Follow instructions from your health care provider about how to take care of any incisions. Make sure you: ? Wash your hands with soap and water for at least 20 seconds before and after you change your bandage (dressing). If  soap and water are not available, use hand sanitizer. ? Change your dressing as told by your health care provider. ? Leave stitches (sutures), skin glue, or adhesive strips in place. These skin closures may need to stay in place for 2 weeks or longer. If adhesive strip edges start to loosen and curl up, you may trim the loose edges. Do not remove adhesive strips completely unless your health care provider tells you to do that.  Check your incision areas every day for signs of infection. Check for: ? Redness, swelling, or pain. ? More fluid or blood. ? Warmth. ? Pus or a bad smell.   Activity  Do not lift anything that is heavier than 5 lb (2.3 kg), or the limit that you are told, until your health care provider says that it is safe.  Return to your normal activities as told by your health care provider. Ask your health care provider what activities are safe for you.  Rest as told by your health care provider.  Avoid sitting for a long time without moving. Get up to take short walks every 1-2 hours. This is important to improve blood flow and breathing. Ask for help if you feel weak or unsteady.  Do not douche, use tampons, or have sexual intercourse until your health care provider approves.  Practice deep breathing and coughing. If it hurts to cough, try holding a pillow against your belly as you cough.   General instructions  Do not use any products that contain nicotine or tobacco. These products include cigarettes, chewing tobacco, and vaping   devices, such as e-cigarettes. These can delay incision healing. If you need help quitting, ask your health care provider.  Do not take baths, swim, or use a hot tub until your health care provider approves. Ask your health care provider if you may take showers. You may only be allowed to take sponge baths.  Do not drink alcohol until your health care provider says it is okay.  Wear compression stockings as told by your health care provider.  These stockings help to prevent blood clots and reduce swelling in your legs.  Keep all follow-up visits. This is important. Contact a health care provider if:  You have a fever or chills.  You have more redness, swelling, or pain around your incision.  You have fluid, blood, pus, or a bad smell coming from your incision.  Your incision feels warm to the touch.  You have a rash.  You have pain when you urinate or blood in your urine.  You have nausea, vomiting, or diarrhea. Get help right away if:  You have trouble breathing or shortness of breath.  You have chest pain.  You feel weak or light-headed.  You have pain, swelling, or redness in your legs.  You become dizzy and faint.  You have heavy vaginal bleeding or blood clots that are larger than a quarter in size.  You have an incision that is opening up.  You have increasing abdominal pain that does not get better with medicine. These symptoms may represent a serious problem that is an emergency. Do not wait to see if the symptoms will go away. Get medical help right away. Call your local emergency services (911 in the [place] Do not drive yourself to the hospital. Summary  After the procedure, it is common to have pain at the incision sites.  If you were prescribed an antibiotic medicine, take it as told by your health care provider. Do not stop using the antibiotic even if you start to feel better.  Do not douche, use tampons, or have sexual intercourse until your health care provider approves.  Return to your normal activities as told by your health care provider. Ask your health care provider what activities are safe for you. This information is not intended to replace advice given to you by your health care provider. Make sure you discuss any questions you have with your health care provider. Document Revised: 08/02/2019 Document Reviewed: 08/02/2019 Elsevier Patient Education  2021 Elsevier Inc.  

## 2020-05-22 ENCOUNTER — Encounter (HOSPITAL_BASED_OUTPATIENT_CLINIC_OR_DEPARTMENT_OTHER): Payer: Self-pay | Admitting: Obstetrics and Gynecology

## 2020-05-22 NOTE — Op Note (Signed)
NAMEROSELLE, Kristina Joyce MEDICAL RECORD NO: [id number] ACCOUNT NO: [id number] DATE OF BIRTH: 1985-12-12 FACILITY: [facility] LOCATION: [facility] PHYSICIAN: Dally Oshel L. Helane Rima, MD  Operative Report   DATE OF PROCEDURE: 05/20/2020  PREOPERATIVE DIAGNOSES:  Symptomatic fibroids and possible left ovarian endometrioma.  POSTOPERATIVE DIAGNOSES:  Symptomatic fibroids, left ovarian cyst and pelvic adhesions.  PROCEDURE:  Abdominal myomectomy, extensive lysis of adhesions and drainage of left ovarian cyst.  SURGEON:  Gemini Bunte L. Helane Rima, MD  ASSISTANT:  Dr. Linda Hedges.  ESTIMATED BLOOD LOSS:  550 mL.  COMPLICATIONS:  None.  DRAINS:  Foley catheter.  PATHOLOGY:  Uterine fibroids.  DESCRIPTION OF PROCEDURE:  The patient was taken to the operating room, she was intubated.  She was prepped and draped in the usual sterile fashion.  Foley catheter was inserted.  A low transverse incision was made, carried down to the fascia.  Fascia  was scored in the midline, extended laterally.  The rectus muscles were separated in the midline.  The peritoneum was entered bluntly. Exam of the uterus revealed a diffusely myomatous uterus.  There was a very large posterior fibroid that was adherent  posteriorly.  We extended the incision in order to exteriorize the uterus.  We were able to elevate the uterus up through the incision.  Inspection revealed she had normal fallopian tubes and normal right ovary.  She had some adhesions involving the  posterior fibroid, which I think was pedunculated for the most part that was at least 13-14 cm and it was stuck to the whole posterior wall of the uterus and it was stuck to the left ovary and tube and there was a small simple cyst and a collection of  fluid there, which I was able to free and drained.  We started with the largest fibroid first by taking a Bovie and series of clamps and clamping with curved Heaney clamps across the base of the fibroid. Each pedicle was clamped,  cut and suture ligated  using 0 Vicryl suture.  We removed the larger one first and then ____ the other fibroids were removed, 7 in total.  The two medium-sized 3 cm fibroids that were involving the anterior wall of the uterus and some smaller ones involving the fundus.  Most  of the fibroids were removed.  Because of the EBL, we felt like some of the other ones we could not remove.  There were some smaller ones deep in the uterus.  We did not enter the endometrium with the current myomectomies; however, she has to have a  C-section with the next when she does get pregnant.  There was one fibroid about 3 cm that was in the left broad ligament near the uterine artery, which appeared very vascular and I did not remove that one because of its location.  We wanted to avoid any  excessive blood loss as well as the need to do an emergent hysterectomy.  After 7 fibroids were removed, the incisions were closed sequentially with running stitches using 0 Vicryl suture in figure-of-eight.  Hemostasis was actually pretty good.  There  was some oozing posteriorly in the cul-de-sac, I think where this fibroid had been adherent.  We irrigated and we inspected.  There was no active bleeding.  We applied pressure for a while and the oozing appeared to be much improved. We then applied  Arista and the bleeding was very much under control at that point. I then placed 2 pieces of Interceed basically across the entire posterior wall of the  uterus because I felt that she has had high chance for significant adhesions involving the posterior  wall of the uterus to the rectosigmoid and careful attention should be performed when her C-section is performed.  Hemostasis was very good.  All sponges and laparotomy pads were removed from the abdominal cavity.  The peritoneum was closed using 0  Vicryl.  The fascia was closed using 0 Vicryl in a running stitch.  The subcutaneous was hemostasis.  The skin was closed with 3-0 Vicryl on a  Keith needle.  Steri-Strips, benzoin and a honeycomb dressing were applied.  All sponge, lap and instrument  counts were correct x2.  The patient went to recovery room in good condition.   NIK D: 05/21/2020 11:17:44 am T: 05/22/2020 1:48:00 am  JOB: [id number]/ [id number]

## 2021-05-12 IMAGING — MR MR PELVIS WO/W CM
7 of 11 series · 31 of 48 positions shown · IV contrast (13ml Multihance)
Comparison: CT on 10/31/2019

CLINICAL DATA: Right-sided pelvic pain.  Fibroids

EXAM:
MRI PELVIS WITHOUT AND WITH CONTRAST
TECHNIQUE: Multiplanar multisequence MR imaging of the pelvis was performed
both before and after administration of intravenous contrast.
CONTRAST:  13mL MULTIHANCE GADOBENATE DIMEGLUMINE 529 MG/ML IV SOLN

[Series 2: cor haste · coronal · 6.0mm · 0.78mm/px · 4 of 25 slices shown]
[im 1/25]
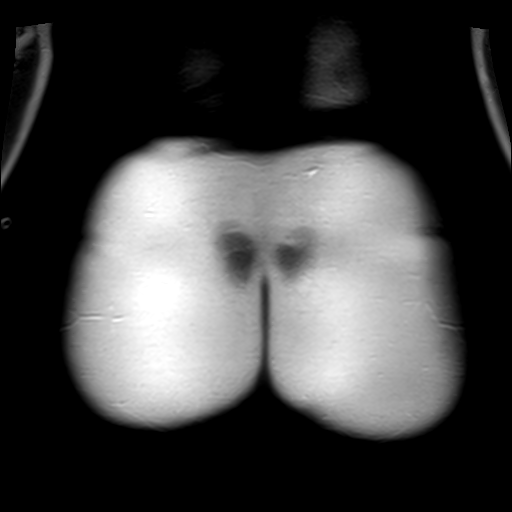
[im 9/25]
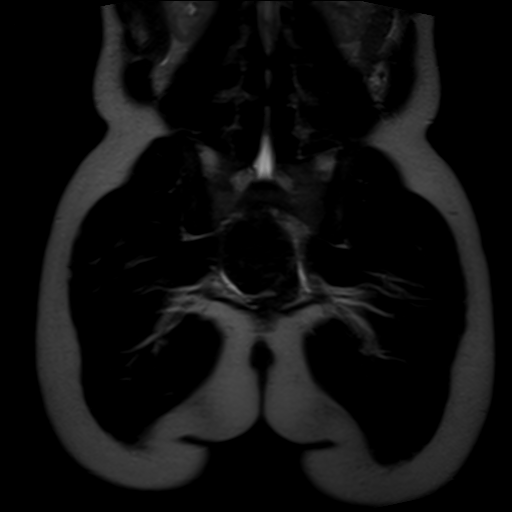
[im 17/25]
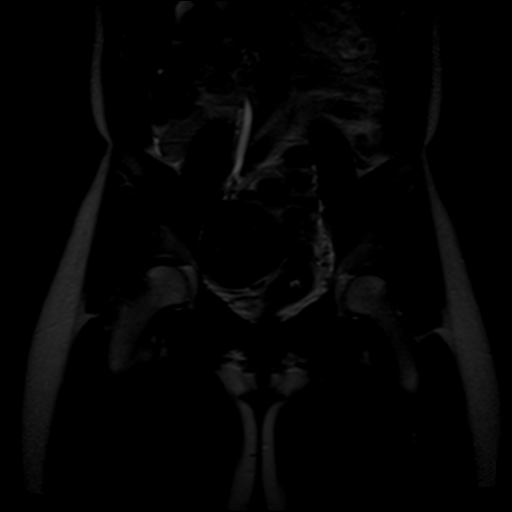
[im 25/25]
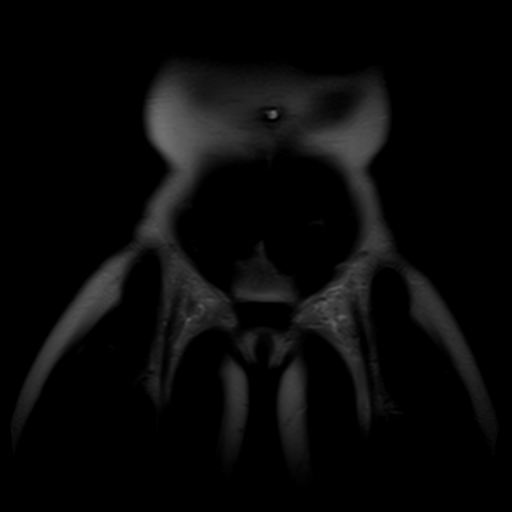

[Series 3: t2_tse_sag · sagittal · 5.0mm · 1.05mm/px · 4 of 27 slices shown]
[im 1/27]
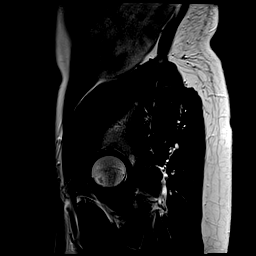
[im 9/27]
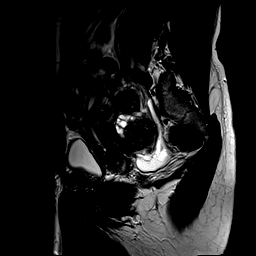
[im 18/27]
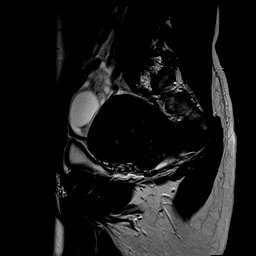
[im 27/27]
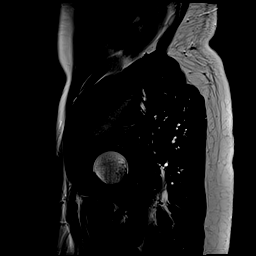

[Series 4: t2_tse axial · axial · 7.0mm · 0.98mm/px · z∈[-103,+125]mm · 4 of 26 slices shown]
[im 1/26]
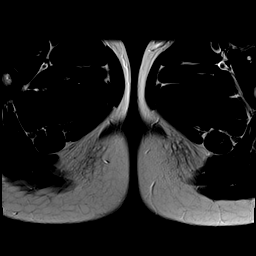
[im 9/26]
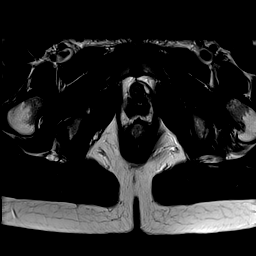
[im 17/26]
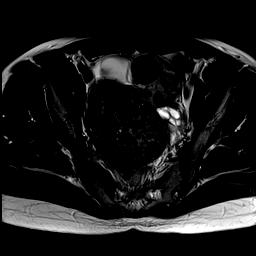
[im 26/26]
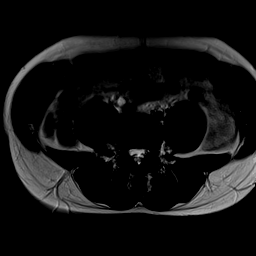

[Series 5: t2_tse axial fs · axial · 7.0mm · 0.98mm/px · z∈[-87,+140]mm · 5 of 26 slices shown]
[im 1/26]
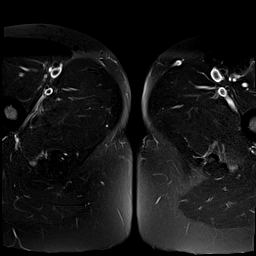
[im 7/26]
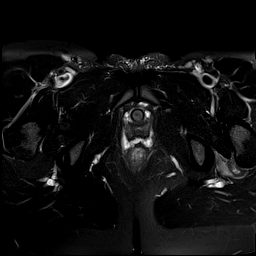
[im 13/26]
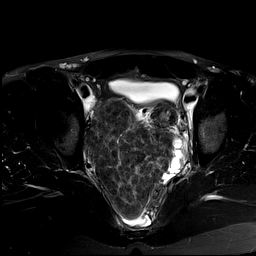
[im 19/26]
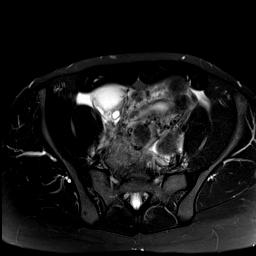
[im 26/26]
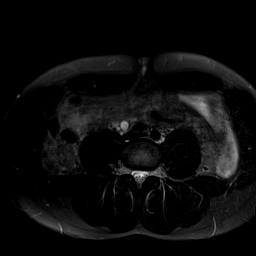

[Series 6: axial spgr · axial · 7.0mm · 0.98mm/px · z∈[-89,+139]mm · 5 of 26 slices shown]
[im 1/26]
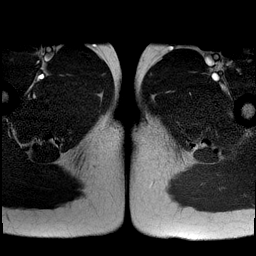
[im 7/26]
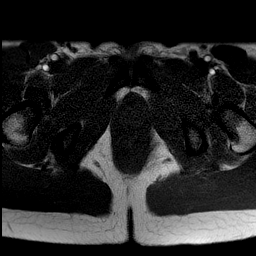
[im 13/26]
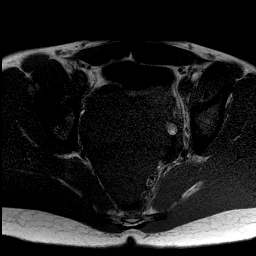
[im 19/26]
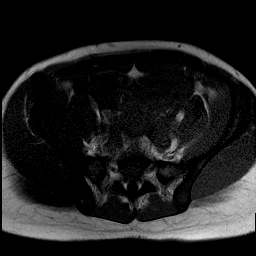
[im 26/26]
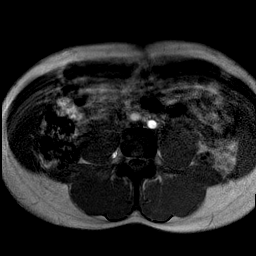

[Series 7: axial spgr pre · axial · non-contrast · 7.0mm · 0.49mm/px · z∈[-96,+132]mm · 5 of 26 slices shown]
[im 1/26]
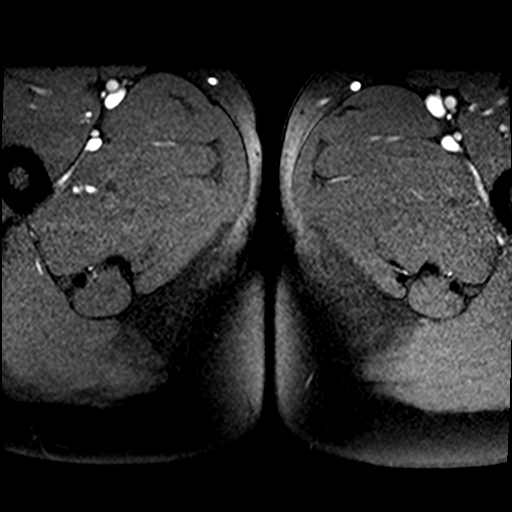
[im 7/26]
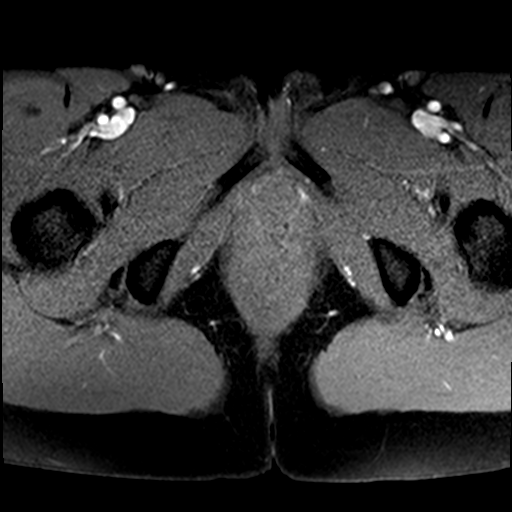
[im 13/26]
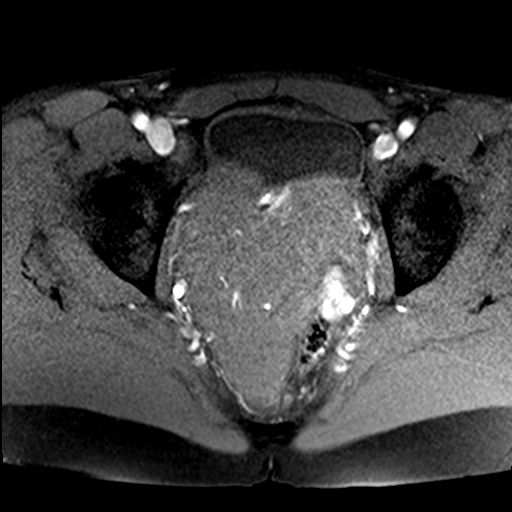
[im 19/26]
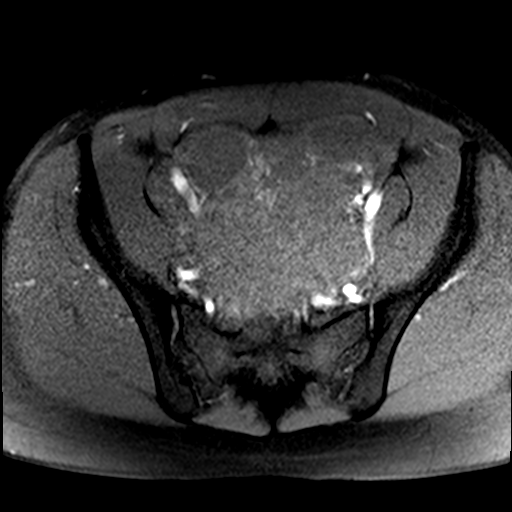
[im 26/26]
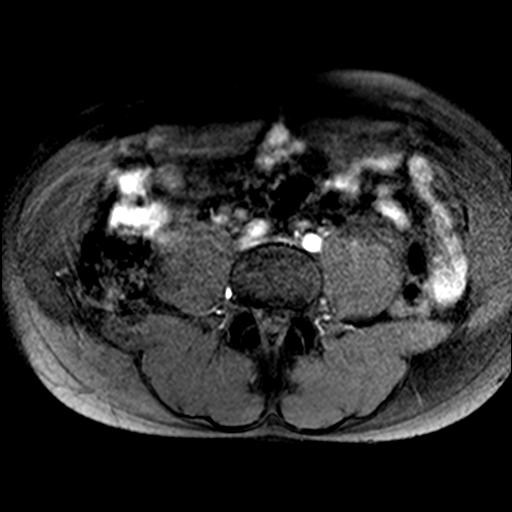

[Series 8: axial spgr post · axial · 7.0mm · 0.49mm/px · z∈[-96,+68]mm · 4 of 26 slices shown]
[im 1/26]
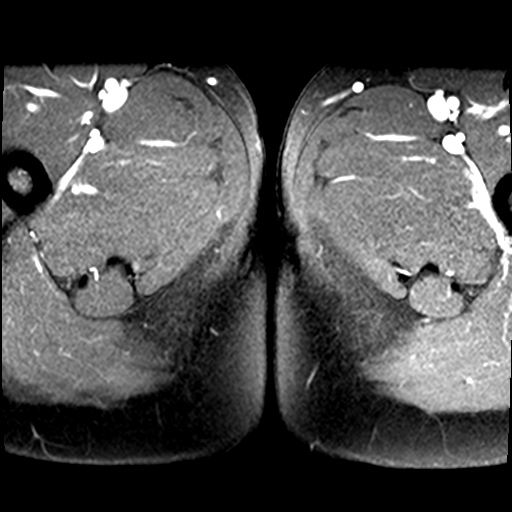
[im 7/26]
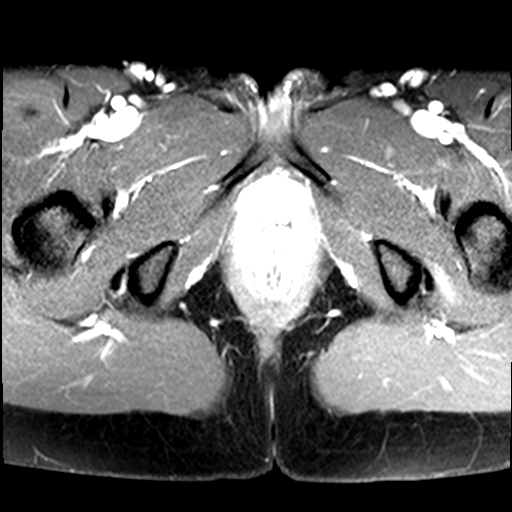
[im 13/26]
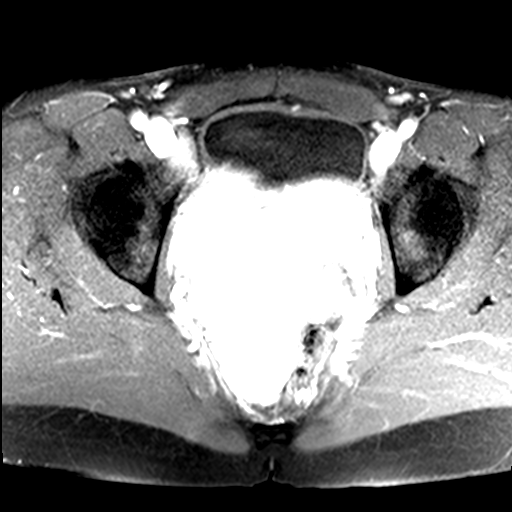
[im 19/26]
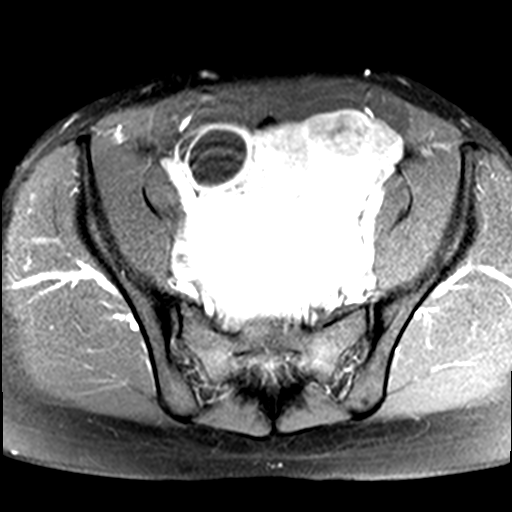

[31 of 48 positions shown; findings below may reference images not displayed]

FINDINGS: Lower Urinary Tract: No bladder or urethral abnormality identified.

Bowel:  Unremarkable visualized pelvic bowel loops.

Vascular/Lymphatic: No pathologically enlarged lymph nodes or other
significant abnormality.

Reproductive:

-- Uterus: Measures 14.2 x 6.7 x 7.6 cm (volume = 380 cm^3).
Multiple uterine fibroids are seen which involve uterus diffusely.
The majority of these fibroids are small in size, measuring to
cm. They are intramural or subserosal in location. A single larger
pedunculated is seen arising from left posterior uterine corpus, as
described below.

-- Intracavitary fibroids:  None.

-- Pedunculated fibroids: A dominant pedunculated fibroid is seen
arising from the right posterior uterine corpus. This measures
x 8.2 x 7.5 cm. Soft tissue pedicle of attachment measures 3.4 cm in
thickness.

-- Fibroid contrast enhancement: All fibroids show contrast
enhancement, without significant degeneration/devascularization.

-- Right ovary: Appears normal. A 3.1 cm follicle is seen. No mass
identified.

-- Left ovary: Appears normal. Multiple nodular areas of T1
hyperintensity are seen in the left adnexa adjacent to the left
ovary and uterus, consistent with endometriosis. A T1 hyperintense
fluid collection is seen along the posterior aspect of the left
ovary which measures 3.5 x 2.3 cm (image [DATE]), consistent with a
small endometrioma.

Other: No abnormal free fluid.

Musculoskeletal:  Unremarkable.
IMPRESSION: Diffuse uterine involvement by small fibroids measuring up to
cm. A dominant 11.8 cm pedunculated fibroid is seen arising from the
right posterior uterine corpus. No intracavitary fibroids
identified.

Left adnexal endometriosis, with small endometrioma measuring
cm.

Normal appearance of both ovaries.

## 2021-06-19 NOTE — Congregational Nurse Program (Signed)
  Dept: [phone]   Congregational Nurse Program Note  Date of Encounter: 06/17/2021  Past Medical History: Past Medical History:  Diagnosis Date   Abnormal Pap smear    Allergy    Candida vaginitis    Chlamydia 2007   Dysplasia of cervix, low grade (CIN 1)    HGSIL (high grade squamous intraepithelial dysplasia)    HSV-1 infection    Oligo-ovulation     Encounter Details:  CNP Questionnaire - 06/19/21 1638       Questionnaire   Do you give verbal consent to treat you today? Yes    Location Patient [place]   Visit Setting Church or Organization    Patient Status Unknown    Sport and exercise psychologist or [place] Referral N/A    Medication N/A    Medical Provider Yes    Screening Referrals N/A    Medical Referral N/A    Medical Appointment Made N/A    Housing/Utilities N/A    Interpersonal Safety N/A    Intervention Blood pressure;Spiritual Care;Educate;Counsel;Support    ED Visit Averted N/A    Life-Saving Intervention Made N/A             Today's Vitals   06/17/21 1345  BP: 110/76  Pulse: 71  Resp: 16  Temp: 98 F (36.7 C)  TempSrc: Temporal  SpO2: 97%   There is no height or weight on file to calculate BMI.  Patient came to obtain weight and vital signs. Patient educated about the congregational nurse role.   Andrez Grime, BSN, RN, CRRN,CMSRN
